# Patient Record
Sex: Female | Born: 1945 | Hispanic: Yes | Marital: Single | Smoking: Never smoker
Health system: Southern US, Community
[De-identification: ages and names within clinical notes are randomized; demographics above are authoritative.]

## PROBLEM LIST (undated history)

## (undated) DIAGNOSIS — I639 Cerebral infarction, unspecified: Secondary | ICD-10-CM

---

## 2017-01-16 ENCOUNTER — Emergency Department (HOSPITAL_COMMUNITY): Payer: Medicare (Managed Care)

## 2017-01-16 ENCOUNTER — Encounter (HOSPITAL_COMMUNITY): Payer: Self-pay | Admitting: Emergency Medicine

## 2017-01-16 ENCOUNTER — Inpatient Hospital Stay (HOSPITAL_COMMUNITY)
Admission: EM | Admit: 2017-01-16 | Discharge: 2017-02-03 | DRG: 100 | Disposition: E | Payer: Medicare (Managed Care) | Attending: Pulmonary Disease | Admitting: Pulmonary Disease

## 2017-01-16 ENCOUNTER — Inpatient Hospital Stay (HOSPITAL_COMMUNITY): Payer: Medicare (Managed Care)

## 2017-01-16 DIAGNOSIS — G300 Alzheimer's disease with early onset: Secondary | ICD-10-CM | POA: Diagnosis not present

## 2017-01-16 DIAGNOSIS — I959 Hypotension, unspecified: Secondary | ICD-10-CM | POA: Diagnosis not present

## 2017-01-16 DIAGNOSIS — Z79899 Other long term (current) drug therapy: Secondary | ICD-10-CM

## 2017-01-16 DIAGNOSIS — E7800 Pure hypercholesterolemia, unspecified: Secondary | ICD-10-CM | POA: Diagnosis present

## 2017-01-16 DIAGNOSIS — G9340 Encephalopathy, unspecified: Secondary | ICD-10-CM | POA: Diagnosis not present

## 2017-01-16 DIAGNOSIS — D72829 Elevated white blood cell count, unspecified: Secondary | ICD-10-CM | POA: Diagnosis not present

## 2017-01-16 DIAGNOSIS — R1110 Vomiting, unspecified: Secondary | ICD-10-CM | POA: Diagnosis not present

## 2017-01-16 DIAGNOSIS — I469 Cardiac arrest, cause unspecified: Secondary | ICD-10-CM | POA: Diagnosis not present

## 2017-01-16 DIAGNOSIS — G40901 Epilepsy, unspecified, not intractable, with status epilepticus: Principal | ICD-10-CM | POA: Diagnosis present

## 2017-01-16 DIAGNOSIS — K921 Melena: Secondary | ICD-10-CM | POA: Diagnosis present

## 2017-01-16 DIAGNOSIS — J9601 Acute respiratory failure with hypoxia: Secondary | ICD-10-CM | POA: Diagnosis not present

## 2017-01-16 DIAGNOSIS — R29810 Facial weakness: Secondary | ICD-10-CM | POA: Diagnosis present

## 2017-01-16 DIAGNOSIS — R4182 Altered mental status, unspecified: Secondary | ICD-10-CM | POA: Diagnosis present

## 2017-01-16 DIAGNOSIS — Z23 Encounter for immunization: Secondary | ICD-10-CM

## 2017-01-16 DIAGNOSIS — I10 Essential (primary) hypertension: Secondary | ICD-10-CM | POA: Diagnosis present

## 2017-01-16 DIAGNOSIS — G301 Alzheimer's disease with late onset: Secondary | ICD-10-CM | POA: Diagnosis not present

## 2017-01-16 DIAGNOSIS — F0280 Dementia in other diseases classified elsewhere without behavioral disturbance: Secondary | ICD-10-CM | POA: Diagnosis present

## 2017-01-16 DIAGNOSIS — I639 Cerebral infarction, unspecified: Secondary | ICD-10-CM | POA: Diagnosis not present

## 2017-01-16 DIAGNOSIS — J9811 Atelectasis: Secondary | ICD-10-CM

## 2017-01-16 DIAGNOSIS — R570 Cardiogenic shock: Secondary | ICD-10-CM | POA: Diagnosis not present

## 2017-01-16 DIAGNOSIS — Z1612 Extended spectrum beta lactamase (ESBL) resistance: Secondary | ICD-10-CM | POA: Diagnosis not present

## 2017-01-16 DIAGNOSIS — R739 Hyperglycemia, unspecified: Secondary | ICD-10-CM | POA: Diagnosis present

## 2017-01-16 DIAGNOSIS — R569 Unspecified convulsions: Secondary | ICD-10-CM

## 2017-01-16 DIAGNOSIS — Z9289 Personal history of other medical treatment: Secondary | ICD-10-CM

## 2017-01-16 DIAGNOSIS — R443 Hallucinations, unspecified: Secondary | ICD-10-CM | POA: Diagnosis not present

## 2017-01-16 DIAGNOSIS — I69354 Hemiplegia and hemiparesis following cerebral infarction affecting left non-dominant side: Secondary | ICD-10-CM | POA: Diagnosis not present

## 2017-01-16 DIAGNOSIS — R1310 Dysphagia, unspecified: Secondary | ICD-10-CM | POA: Diagnosis not present

## 2017-01-16 DIAGNOSIS — G40909 Epilepsy, unspecified, not intractable, without status epilepticus: Secondary | ICD-10-CM | POA: Diagnosis not present

## 2017-01-16 DIAGNOSIS — G309 Alzheimer's disease, unspecified: Secondary | ICD-10-CM | POA: Diagnosis present

## 2017-01-16 DIAGNOSIS — B9620 Unspecified Escherichia coli [E. coli] as the cause of diseases classified elsewhere: Secondary | ICD-10-CM | POA: Diagnosis present

## 2017-01-16 DIAGNOSIS — E876 Hypokalemia: Secondary | ICD-10-CM | POA: Diagnosis present

## 2017-01-16 DIAGNOSIS — Z66 Do not resuscitate: Secondary | ICD-10-CM | POA: Diagnosis not present

## 2017-01-16 DIAGNOSIS — R338 Other retention of urine: Secondary | ICD-10-CM | POA: Diagnosis not present

## 2017-01-16 DIAGNOSIS — R402352 Coma scale, best motor response, localizes pain, at arrival to emergency department: Secondary | ICD-10-CM | POA: Diagnosis present

## 2017-01-16 DIAGNOSIS — N390 Urinary tract infection, site not specified: Secondary | ICD-10-CM | POA: Diagnosis present

## 2017-01-16 DIAGNOSIS — R402112 Coma scale, eyes open, never, at arrival to emergency department: Secondary | ICD-10-CM | POA: Diagnosis present

## 2017-01-16 DIAGNOSIS — R1319 Other dysphagia: Secondary | ICD-10-CM | POA: Diagnosis not present

## 2017-01-16 DIAGNOSIS — I63131 Cerebral infarction due to embolism of right carotid artery: Secondary | ICD-10-CM | POA: Diagnosis not present

## 2017-01-16 DIAGNOSIS — I63511 Cerebral infarction due to unspecified occlusion or stenosis of right middle cerebral artery: Secondary | ICD-10-CM | POA: Diagnosis present

## 2017-01-16 DIAGNOSIS — R159 Full incontinence of feces: Secondary | ICD-10-CM | POA: Diagnosis present

## 2017-01-16 DIAGNOSIS — E162 Hypoglycemia, unspecified: Secondary | ICD-10-CM | POA: Diagnosis not present

## 2017-01-16 DIAGNOSIS — R402212 Coma scale, best verbal response, none, at arrival to emergency department: Secondary | ICD-10-CM | POA: Diagnosis present

## 2017-01-16 DIAGNOSIS — R092 Respiratory arrest: Secondary | ICD-10-CM

## 2017-01-16 DIAGNOSIS — R1312 Dysphagia, oropharyngeal phase: Secondary | ICD-10-CM | POA: Diagnosis not present

## 2017-01-16 DIAGNOSIS — R34 Anuria and oliguria: Secondary | ICD-10-CM | POA: Diagnosis present

## 2017-01-16 DIAGNOSIS — B9629 Other Escherichia coli [E. coli] as the cause of diseases classified elsewhere: Secondary | ICD-10-CM | POA: Diagnosis not present

## 2017-01-16 DIAGNOSIS — D72828 Other elevated white blood cell count: Secondary | ICD-10-CM | POA: Diagnosis not present

## 2017-01-16 DIAGNOSIS — F0281 Dementia in other diseases classified elsewhere with behavioral disturbance: Secondary | ICD-10-CM | POA: Diagnosis not present

## 2017-01-16 HISTORY — DX: Cerebral infarction, unspecified: I63.9

## 2017-01-16 LAB — I-STAT ARTERIAL BLOOD GAS, ED
ACID-BASE EXCESS: 4 mmol/L — AB (ref 0.0–2.0)
BICARBONATE: 26.4 mmol/L (ref 20.0–28.0)
O2 Saturation: 100 %
PCO2 ART: 33.7 mmHg (ref 32.0–48.0)
PH ART: 7.503 — AB (ref 7.350–7.450)
PO2 ART: 496 mmHg — AB (ref 83.0–108.0)
TCO2: 27 mmol/L (ref 22–32)

## 2017-01-16 LAB — BRAIN NATRIURETIC PEPTIDE: B Natriuretic Peptide: 57.3 pg/mL (ref 0.0–100.0)

## 2017-01-16 LAB — CBC
HEMATOCRIT: 43.9 % (ref 36.0–46.0)
Hemoglobin: 14.2 g/dL (ref 12.0–15.0)
MCH: 29.3 pg (ref 26.0–34.0)
MCHC: 32.3 g/dL (ref 30.0–36.0)
MCV: 90.7 fL (ref 78.0–100.0)
Platelets: 870 10*3/uL — ABNORMAL HIGH (ref 150–400)
RBC: 4.84 MIL/uL (ref 3.87–5.11)
RDW: 15.5 % (ref 11.5–15.5)
WBC: 17.1 10*3/uL — ABNORMAL HIGH (ref 4.0–10.5)

## 2017-01-16 LAB — COMPREHENSIVE METABOLIC PANEL
ALT: 28 U/L (ref 14–54)
ANION GAP: 13 (ref 5–15)
AST: 37 U/L (ref 15–41)
Albumin: 3.9 g/dL (ref 3.5–5.0)
Alkaline Phosphatase: 95 U/L (ref 38–126)
BUN: 17 mg/dL (ref 6–20)
CHLORIDE: 106 mmol/L (ref 101–111)
CO2: 18 mmol/L — ABNORMAL LOW (ref 22–32)
Calcium: 9.4 mg/dL (ref 8.9–10.3)
Creatinine, Ser: 0.91 mg/dL (ref 0.44–1.00)
Glucose, Bld: 158 mg/dL — ABNORMAL HIGH (ref 65–99)
POTASSIUM: 3.9 mmol/L (ref 3.5–5.1)
Sodium: 137 mmol/L (ref 135–145)
Total Bilirubin: 0.8 mg/dL (ref 0.3–1.2)
Total Protein: 7.7 g/dL (ref 6.5–8.1)

## 2017-01-16 LAB — I-STAT CHEM 8, ED
BUN: 19 mg/dL (ref 6–20)
CALCIUM ION: 1.13 mmol/L — AB (ref 1.15–1.40)
Chloride: 106 mmol/L (ref 101–111)
Creatinine, Ser: 0.7 mg/dL (ref 0.44–1.00)
Glucose, Bld: 162 mg/dL — ABNORMAL HIGH (ref 65–99)
HEMATOCRIT: 47 % — AB (ref 36.0–46.0)
HEMOGLOBIN: 16 g/dL — AB (ref 12.0–15.0)
POTASSIUM: 3.8 mmol/L (ref 3.5–5.1)
SODIUM: 139 mmol/L (ref 135–145)
TCO2: 20 mmol/L — AB (ref 22–32)

## 2017-01-16 LAB — DIFFERENTIAL
BASOS ABS: 0.1 10*3/uL (ref 0.0–0.1)
BASOS PCT: 1 %
EOS ABS: 0.3 10*3/uL (ref 0.0–0.7)
EOS PCT: 2 %
Lymphocytes Relative: 35 %
Lymphs Abs: 6 10*3/uL — ABNORMAL HIGH (ref 0.7–4.0)
MONOS PCT: 6 %
Monocytes Absolute: 1 10*3/uL (ref 0.1–1.0)
Neutro Abs: 9.8 10*3/uL — ABNORMAL HIGH (ref 1.7–7.7)
Neutrophils Relative %: 56 %

## 2017-01-16 LAB — ETHANOL: Alcohol, Ethyl (B): <10 mg/dL (ref <10)

## 2017-01-16 LAB — LACTIC ACID, PLASMA
LACTIC ACID, VENOUS: 3.2 mmol/L — AB (ref 0.5–1.9)
LACTIC ACID, VENOUS: 1.5 mmol/L (ref 0.5–1.9)

## 2017-01-16 LAB — GLUCOSE, CAPILLARY
GLUCOSE-CAPILLARY: 117 mg/dL — AB (ref 65–99)
Glucose-Capillary: 111 mg/dL — ABNORMAL HIGH (ref 65–99)

## 2017-01-16 LAB — MAGNESIUM: MAGNESIUM: 1.8 mg/dL (ref 1.7–2.4)

## 2017-01-16 LAB — PROCALCITONIN

## 2017-01-16 LAB — PROTIME-INR
INR: 1.04
Prothrombin Time: 13.6 seconds (ref 11.4–15.2)

## 2017-01-16 LAB — MRSA PCR SCREENING: MRSA by PCR: NEGATIVE

## 2017-01-16 LAB — TRIGLYCERIDES: TRIGLYCERIDES: 110 mg/dL (ref <150)

## 2017-01-16 LAB — TROPONIN I
Troponin I: <0.03 ng/mL (ref <0.03)
Troponin I: <0.03 ng/mL (ref <0.03)

## 2017-01-16 LAB — APTT: APTT: 28 s (ref 24–36)

## 2017-01-16 LAB — PHOSPHORUS: PHOSPHORUS: 2.9 mg/dL (ref 2.5–4.6)

## 2017-01-16 LAB — POC OCCULT BLOOD, ED: Fecal Occult Bld: POSITIVE — AB

## 2017-01-16 LAB — I-STAT TROPONIN, ED: TROPONIN I, POC: 0 ng/mL (ref 0.00–0.08)

## 2017-01-16 LAB — CORTISOL: CORTISOL PLASMA: 20.7 ug/dL

## 2017-01-16 MED ORDER — SODIUM CHLORIDE 0.9 % IV SOLN: 250.0000 mL | INTRAVENOUS | Status: DC | PRN

## 2017-01-16 MED ORDER — ETOMIDATE 2 MG/ML IV SOLN
INTRAVENOUS | Status: DC | PRN
  Administered 2017-01-16: 15 mg via INTRAVENOUS

## 2017-01-16 MED ORDER — PANTOPRAZOLE SODIUM 40 MG IV SOLR
40.0000 mg | Freq: Every day | INTRAVENOUS | Status: DC
  Administered 2017-01-16 – 2017-01-18 (×3): 40 mg via INTRAVENOUS
  Filled 2017-01-16 (×3): qty 40

## 2017-01-16 MED ORDER — INSULIN ASPART 100 UNIT/ML ~~LOC~~ SOLN
0.0000 [IU] | SUBCUTANEOUS | Status: DC
  Administered 2017-01-17 – 2017-01-23 (×4): 1 [IU] via SUBCUTANEOUS

## 2017-01-16 MED ORDER — HYDRALAZINE HCL 20 MG/ML IJ SOLN
10.0000 mg | INTRAMUSCULAR | Status: DC | PRN
  Administered 2017-01-17 – 2017-01-19 (×4): 10 mg via INTRAVENOUS
  Filled 2017-01-16 (×6): qty 1

## 2017-01-16 MED ORDER — SODIUM CHLORIDE 0.9 % IV SOLN
1500.0000 mg | INTRAVENOUS | Status: AC
  Administered 2017-01-16: 1500 mg via INTRAVENOUS
  Filled 2017-01-16: qty 15

## 2017-01-16 MED ORDER — LORAZEPAM 2 MG/ML IJ SOLN: 1.0000 mg | INTRAMUSCULAR | Status: DC | PRN

## 2017-01-16 MED ORDER — LEVETIRACETAM 500 MG/5ML IV SOLN
750.0000 mg | Freq: Two times a day (BID) | INTRAVENOUS | Status: DC
  Administered 2017-01-17 – 2017-01-23 (×14): 750 mg via INTRAVENOUS
  Filled 2017-01-16 (×16): qty 7.5

## 2017-01-16 MED ORDER — SODIUM CHLORIDE 0.9 % IV BOLUS (SEPSIS): 1000.0000 mL | Freq: Once | INTRAVENOUS | Status: AC

## 2017-01-16 MED ORDER — HEPARIN SODIUM (PORCINE) 5000 UNIT/ML IJ SOLN
5000.0000 [IU] | Freq: Three times a day (TID) | INTRAMUSCULAR | Status: DC
  Administered 2017-01-16 – 2017-01-24 (×23): 5000 [IU] via SUBCUTANEOUS
  Filled 2017-01-16 (×23): qty 1

## 2017-01-16 MED ORDER — PROPOFOL 1000 MG/100ML IV EMUL
INTRAVENOUS | Status: AC
  Filled 2017-01-16: qty 100

## 2017-01-16 MED ORDER — IOPAMIDOL (ISOVUE-370) INJECTION 76%
INTRAVENOUS | Status: AC
  Administered 2017-01-16: 100 mL via INTRAVENOUS
  Filled 2017-01-16: qty 100

## 2017-01-16 MED ORDER — LORAZEPAM 2 MG/ML IJ SOLN
1.0000 mg | INTRAMUSCULAR | Status: AC
  Administered 2017-01-16: 1 mg via INTRAVENOUS
  Filled 2017-01-16: qty 1

## 2017-01-16 MED ORDER — LORAZEPAM BOLUS VIA INFUSION: 2.0000 mg | Freq: Once | INTRAVENOUS | Status: DC

## 2017-01-16 MED ORDER — PROPOFOL 1000 MG/100ML IV EMUL
5.0000 ug/kg/min | INTRAVENOUS | Status: DC
  Administered 2017-01-16: 10 ug/kg/min via INTRAVENOUS

## 2017-01-16 MED ORDER — LORAZEPAM 2 MG/ML IJ SOLN
2.0000 mg | Freq: Once | INTRAMUSCULAR | Status: AC
  Administered 2017-01-16: 2 mg via INTRAVENOUS

## 2017-01-16 MED ORDER — LORAZEPAM 2 MG/ML IJ SOLN
INTRAMUSCULAR | Status: AC
  Filled 2017-01-16: qty 1

## 2017-01-16 MED ORDER — SODIUM CHLORIDE 0.9 % IV SOLN
100.0000 mg | Freq: Two times a day (BID) | INTRAVENOUS | Status: DC
  Administered 2017-01-16 – 2017-01-24 (×16): 100 mg via INTRAVENOUS
  Filled 2017-01-16 (×23): qty 10

## 2017-01-16 MED ORDER — ROCURONIUM BROMIDE 50 MG/5ML IV SOLN
INTRAVENOUS | Status: DC | PRN
  Administered 2017-01-16: 50 mg via INTRAVENOUS

## 2017-01-16 NOTE — ED Notes (Signed)
Pt is waking up, moving legs, more responsive. Dr aware

## 2017-01-16 NOTE — Progress Notes (Addendum)
Called to examine patient for breakthrough seizure after extubation. The patient was speaking with nursing staff, then deviated head the left with leftward eye deviation followed by posturing and generalized seizure activity. Neurology to room STAT following verbal order for 1 mg IV Ativan. Patient still seizing on arrival with Ativan not yet administered: LUE extended, RUE flexed, BLE extended and with diffuse low amplitude tremor-like jerking. Following 1 mg Ativan, seizure activity continued. An additional 2 mg Ativan was administered with cessation of seizure activity.   Assessment/Recommendations: Focal onset seizures with secondary generalization emanating from the prior right frontal ischemic stroke area based on semiology of the events. Now with breakthrough seizure following extubation and discontinuation of propofol.   1. STAT 24 hour EEG ordered. Discussed with EEG technician.  2. Continue Vimpat 100 mg IV q12h.   3. Keppra 1500 mg IV load x 1 now. Continue at scheduled dose of 750 mg IV BID. 4. Ativan PRN seizure. Call Neurology if she remains postictal or if clinical seizure activity recurs.   Addendum 11:30 PM 01/06/2017: Initial tracings of LTM EEG preliminarily reviewed, demonstrating no electrographic seizures.   Electronically signed: Dr. Caryl Pina

## 2017-01-16 NOTE — ED Notes (Addendum)
Propofol started at 10 mcg/kg/m

## 2017-01-16 NOTE — ED Provider Notes (Signed)
[facility]-EMERGENCY DEPT Provider Note  CSN: [id number] Arrival date & time: 2017/01/23  1508  History   Chief Complaint Chief Complaint  Patient presents with  . Code Stroke   HPI Sandra Johnston is a 71 y.o. female.  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  The patient was complaining of a headache after church today.  She had stiffening of her left leg when getting into the car with multiple episodes of vomiting, prompting the call to EMS.  Family was concerned for a stroke.  After EMS arrival, she had multiple witnessed seizures en route to the ED with bilateral shaking of her lower extremities and wrists as well as leftward eye deviation.  She vomited en route to the hospital and had an episode of stool incontinence.  She was unresponsive at the time of ED arrival with no spontaneous respirations.  EMS was unable to obtain a blood pressure, however the patient was tachycardic en route.   The history is provided by the EMS personnel and a relative. No language interpreter was used.   No past medical history on file.  Patient Active Problem List   Diagnosis Date Noted  . Seizures (HCC) 01-23-2017   No past surgical history on file.  OB History    No data available     Home Medications    Prior to Admission medications   Not on File   Family History No family history on file.  Social History Social History  Substance Use Topics  . Smoking status: Not on file  . Smokeless tobacco: Not on file  . Alcohol use Not on file   Allergies   Patient has no allergy information on record.  Review of Systems Review of Systems  Unable to perform ROS: Mental status change   Physical Exam Updated Vital Signs BP (!) 186/97   Pulse 90   Temp (S) 97.7 F (36.5 C)  (Rectal)   Resp 14   Ht  (1.499 m)   Wt 47 kg (103 lb 9.9 oz)   LMP  (LMP Unknown)   SpO2 100%   BMI 20.93 kg/m   Physical Exam  Constitutional: She appears well-developed and well-nourished.  HENT:  Head: Normocephalic and atraumatic.  Eyes: Pupils are equal, round, and reactive to light. Conjunctivae are normal.  Pupils 3mm and briskly responsive to light bilaterally; no eye deviation noted on my exam  Neck: Neck supple.  Cardiovascular: Normal rate, regular rhythm, normal heart sounds and intact distal pulses.   No murmur heard. Pulmonary/Chest: Breath sounds normal. No stridor. She has no wheezes. She has no rales.  No spontaneous respirations  Abdominal: Soft. There is no tenderness. There is no guarding.  Vomitus present  Genitourinary: Rectal exam shows guaiac positive stool.  Genitourinary Comments: Stool incontinence with small amount of bright red blood noted  Musculoskeletal: She exhibits no edema.  Neurological: GCS eye subscore is 1. GCS verbal subscore is 1. GCS motor subscore is 4.  Withdraws all four extremities to pain; spontaneous movement of right upper and lower extremity noted  Skin: Skin is warm and dry.  Nursing note and vitals reviewed.  ED Treatments / Results  Labs (all labs ordered are listed, but only abnormal results are displayed) Labs Reviewed  CBC - Abnormal;  Notable for the following:       Result Value   WBC 17.1 (*)    Platelets 870 (*)    All other components within normal limits  DIFFERENTIAL - Abnormal; Notable for the following:    Neutro Abs 9.8 (*)    Lymphs Abs 6.0 (*)    All other components within normal limits  COMPREHENSIVE METABOLIC PANEL - Abnormal; Notable for the following:    CO2 18 (*)    Glucose, Bld 158 (*)    All other components within normal limits  I-STAT CHEM 8, ED - Abnormal; Notable for the following:    Glucose, Bld 162 (*)    Calcium, Ion 1.13 (*)    TCO2 20 (*)    Hemoglobin 16.0 (*)    HCT 47.0  (*)    All other components within normal limits  POC OCCULT BLOOD, ED - Abnormal; Notable for the following:    Fecal Occult Bld POSITIVE (*)    All other components within normal limits  I-STAT ARTERIAL BLOOD GAS, ED - Abnormal; Notable for the following:    pH, Arterial 7.503 (*)    pO2, Arterial 496.0 (*)    Acid-Base Excess 4.0 (*)    All other components within normal limits  PROTIME-INR  APTT  TRIGLYCERIDES  ETHANOL  URINALYSIS, ROUTINE W REFLEX MICROSCOPIC  RAPID URINE DRUG SCREEN, HOSP PERFORMED  CBC  CREATININE, SERUM  TROPONIN I  TROPONIN I  TROPONIN I  LACTIC ACID, PLASMA  LACTIC ACID, PLASMA  PROCALCITONIN  BRAIN NATRIURETIC PEPTIDE  CORTISOL  BLOOD GAS, ARTERIAL  I-STAT TROPONIN, ED  CBG MONITORING, ED   EKG  EKG Interpretation  Date/Time:  Sunday 01-17-2017 15:09:39 EDT Ventricular Rate:  103 PR Interval:    QRS Duration: 83 QT Interval:  339 QTC Calculation: 444 R Axis:   68 Text Interpretation:  Sinus tachycardia Abnormal R-wave progression, early transition Minimal ST depression, lateral leads No old tracing to compare Confirmed by Pricilla Loveless [id number]) on 01-17-2017 3:20:54 PM      Radiology Ct Angio Head W Or Wo Contrast  Result Date: 01/17/2017 CLINICAL DATA:  Code stroke. 71 year old female code stroke, nonverbal, headache, seizure.  Last seen normal 1420 hours. EXAM: CT ANGIOGRAPHY HEAD AND NECK TECHNIQUE: Multidetector CT imaging of the head and neck was performed using the standard protocol during bolus administration of intravenous contrast. Multiplanar CT image reconstructions and MIPs were obtained to evaluate the vascular anatomy. Carotid stenosis measurements (when applicable) are obtained utilizing NASCET criteria, using the distal internal carotid diameter as the denominator. CONTRAST:  50 mL Isovue 370 COMPARISON:  Head CT without contrast 1547 hours today. FINDINGS: CTA NECK Skeleton: Absent maxillary dentition and occasional  poor mandible dentition. Degenerative changes in the cervical spine. No acute osseous abnormality identified. Upper chest: Intubated. Endotracheal tube terminates above the carina. Enteric tube courses into the thoracic esophagus. Negative lung apices aside from mild scarring and emphysema. No superior mediastinal lymphadenopathy. Other neck: Fluid in the pharynx and posterior nasal cavity in the setting of intubation. Otherwise negative neck soft tissues. Aortic arch: Good contrast bolus timing. Bovine type arch configuration. Mild arch atherosclerosis. No great vessel origin stenosis. Right carotid system: Patent carotid stent from the distal right CCA to the right ICA distal to the bulb. There is mild to moderate in stent stenosis at the level of the right ICA origin and bulb due to combined waist of the stent and some internal soft plaque (series 9,  image 64). Stenosis estimated at 65% with respect to the distal vessel. Distal to the stent the cervical right ICA is negative. Left carotid system: Bovine left CCA origin with no stenosis. Mild plaque at the left carotid bifurcation. No cervical left carotid stenosis. Vertebral arteries: No proximal right subclavian artery stenosis despite soft and calcified plaque at its origin. Normal right vertebral artery origin. Normal right vertebral artery to the skullbase. No proximal left subclavian artery stenosis despite calcified plaque. Normal origin of the dominant left vertebral artery. Normal left vertebral artery to the skullbase. CTA HEAD Posterior circulation: Dominant distal left and non dominant right vertebral arteries are normal to the vertebrobasilar junction. Patent PICA origins. Normal basilar artery. Normal SCA and PCA origins. Posterior communicating arteries are diminutive or absent. Normal PCA branches. Anterior circulation: Both ICA siphons are patent without stenosis. Mild calcified plaque on the left. Normal ophthalmic artery origins. Normal carotid  termini, MCA and ACA origins. Mildly dominant left ACA A1. Anterior communicating artery and bilateral ACA branches are normal. Left MCA M1 segment, bifurcation, and left MCA branches appear normal. Right MCA M1 segment, bifurcation, and right MCA branches appear normal. Venous sinuses: Early contrast bolus timing. Superior sagittal sinus and straight sinus are patent. Anatomic variants: Dominant left vertebral artery. Bovine type aortic arch configuration. Review of the MIP images confirms the above findings IMPRESSION: 1. Negative for emergent large vessel occlusion. And no intracranial arterial stenosis or occlusion identified. 2. Positive for patent right cervical carotid stent with in-stent stenosis at the level of the right ICA origin up to 65%. 3. Negative left carotid and bilateral vertebral arteries. 4. Intubated. ET tube terminates above the carina. Enteric tube courses into the esophagus. Preliminary report of #1 was relayed via text pager to Dr. Weston Settle on 01/31/2017 at 1617 hours. Electronically Signed   By: Odessa Fleming M.D.   On: 01/15/2017 16:26   Ct Angio Neck W Or Wo Contrast  Result Date: 01/14/2017 CLINICAL DATA:  Code stroke. 71 year old female code stroke, nonverbal, headache, seizure.  Last seen normal 1420 hours. EXAM: CT ANGIOGRAPHY HEAD AND NECK TECHNIQUE: Multidetector CT imaging of the head and neck was performed using the standard protocol during bolus administration of intravenous contrast. Multiplanar CT image reconstructions and MIPs were obtained to evaluate the vascular anatomy. Carotid stenosis measurements (when applicable) are obtained utilizing NASCET criteria, using the distal internal carotid diameter as the denominator. CONTRAST:  50 mL Isovue 370 COMPARISON:  Head CT without contrast 1547 hours today. FINDINGS: CTA NECK Skeleton: Absent maxillary dentition and occasional poor mandible dentition. Degenerative changes in the cervical spine. No acute osseous  abnormality identified. Upper chest: Intubated. Endotracheal tube terminates above the carina. Enteric tube courses into the thoracic esophagus. Negative lung apices aside from mild scarring and emphysema. No superior mediastinal lymphadenopathy. Other neck: Fluid in the pharynx and posterior nasal cavity in the setting of intubation. Otherwise negative neck soft tissues. Aortic arch: Good contrast bolus timing. Bovine type arch configuration. Mild arch atherosclerosis. No great vessel origin stenosis. Right carotid system: Patent carotid stent from the distal right CCA to the right ICA distal to the bulb. There is mild to moderate in stent stenosis at the level of the right ICA origin and bulb due to combined waist of the stent and some internal soft plaque (series 9, image 64). Stenosis estimated at 65% with respect to the distal vessel. Distal to the stent the cervical right ICA is negative. Left carotid system: Bovine left CCA  origin with no stenosis. Mild plaque at the left carotid bifurcation. No cervical left carotid stenosis. Vertebral arteries: No proximal right subclavian artery stenosis despite soft and calcified plaque at its origin. Normal right vertebral artery origin. Normal right vertebral artery to the skullbase. No proximal left subclavian artery stenosis despite calcified plaque. Normal origin of the dominant left vertebral artery. Normal left vertebral artery to the skullbase. CTA HEAD Posterior circulation: Dominant distal left and non dominant right vertebral arteries are normal to the vertebrobasilar junction. Patent PICA origins. Normal basilar artery. Normal SCA and PCA origins. Posterior communicating arteries are diminutive or absent. Normal PCA branches. Anterior circulation: Both ICA siphons are patent without stenosis. Mild calcified plaque on the left. Normal ophthalmic artery origins. Normal carotid termini, MCA and ACA origins. Mildly dominant left ACA A1. Anterior communicating  artery and bilateral ACA branches are normal. Left MCA M1 segment, bifurcation, and left MCA branches appear normal. Right MCA M1 segment, bifurcation, and right MCA branches appear normal. Venous sinuses: Early contrast bolus timing. Superior sagittal sinus and straight sinus are patent. Anatomic variants: Dominant left vertebral artery. Bovine type aortic arch configuration. Review of the MIP images confirms the above findings IMPRESSION: 1. Negative for emergent large vessel occlusion. And no intracranial arterial stenosis or occlusion identified. 2. Positive for patent right cervical carotid stent with in-stent stenosis at the level of the right ICA origin up to 65%. 3. Negative left carotid and bilateral vertebral arteries. 4. Intubated. ET tube terminates above the carina. Enteric tube courses into the esophagus. Preliminary report of #1 was relayed via text pager to Dr. Weston Settle on 01/08/2017 at 1617 hours. Electronically Signed   By: Odessa Fleming M.D.   On: 01/06/2017 16:26   Dg Chest Portable 1 View  Result Date: 01/05/2017 CLINICAL DATA:  Tube adjustment. EXAM: PORTABLE CHEST 1 VIEW COMPARISON:  Radiograph of same day. FINDINGS: The heart size and mediastinal contours are within normal limits. Distal tip of endotracheal tube is now 4 cm above the carina, in grossly good position. Nasogastric tube is seen entering stomach. No pneumothorax or pleural effusion is noted. Both lungs are clear. The visualized skeletal structures are unremarkable. IMPRESSION: Endotracheal tube is in good position status post repositioning. No acute cardiopulmonary abnormality seen. Electronically Signed   By: Lupita Raider, M.D.   On: 01/06/2017 15:42   Dg Chest Portable 1 View  Result Date: 01/07/2017 CLINICAL DATA:  Tube placement. EXAM: PORTABLE CHEST 1 VIEW COMPARISON:  None. FINDINGS: The heart size and mediastinal contours are within normal limits. Endotracheal tube tip is seen approximately 2 cm above the  carina. Nasogastric tube is seen with tip in expected position of proximal stomach. No pneumothorax or pleural effusion is noted. Both lungs are clear. The visualized skeletal structures are unremarkable. IMPRESSION: Endotracheal and nasogastric tubes are in grossly good position. No acute cardiopulmonary abnormality seen. Electronically Signed   By: Lupita Raider, M.D.   On: 01/09/2017 15:39   Ct Head Code Stroke Wo Contrast  Result Date: 01/18/2017 CLINICAL DATA:  Code stroke. 71 year old female code stroke, nonverbal, headache, seizure. Last seen normal 1420 hours. EXAM: CT HEAD WITHOUT CONTRAST TECHNIQUE: Contiguous axial images were obtained from the base of the skull through the vertex without intravenous contrast. COMPARISON:  None. FINDINGS: Brain: Chronic appearing encephalomalacia at the right operculum and insula tracking toward the superior right peri-Rolandic cortex. Mild asymmetric enlargement of the right lateral ventricle. Elsewhere confluent bilateral abnormal cerebral white matter hypodensity. Confluent  acute to subacute appearing hypodensity throughout the right basal ganglia (series 3, image 16). Patchy heterogeneous hypodensity throughout the bilateral thalami. No acute intracranial hemorrhage identified. No midline shift, mass effect, or evidence of intracranial mass lesion. No other cytotoxic edema identified. Vascular: Calcified atherosclerosis at the skull base. Skull: Negative. Sinuses/Orbits: Intubated on the scout view. Fluid in the pharynx and nasal cavity. Small fluid levels in both sphenoid sinuses. Otherwise well pneumatized. Other: Visualized orbits and scalp soft tissues are within normal limits. ASPECTS ([place]ber[place] Stroke Program Early CT Score) - Ganglionic level infarction (caudate, lentiform nuclei, internal capsule, insula, M1-M3 cortex): 4 (right insula, M1 and M2 thought to be chronic) - Supraganglionic infarction (M4-M6 cortex): 3 ( right M 5 thought to be chronic) Total  score (0-10 with 10 being normal): 7 IMPRESSION: 1. Mix of acute/subacute and chronic ischemia suspected in the right MCA territory. No associated hemorrhage or mass effect. 2. ASPECTS is 7 3. Underlying chronic small vessel disease. 4. The above was relayed via text pager to Dr. Johnn Hai on 01/13/2017 at 15:50 . Electronically Signed   By: Odessa Fleming M.D.   On: 01/03/2017 15:50    Procedures Procedure Name: Intubation Date/Time: 01/17/2017 4:00 PM Performed by: Levester Fresh Pre-anesthesia Checklist: Emergency Drugs available, Suction available and Patient being monitored Oxygen Delivery Method: Ambu bag Preoxygenation: Pre-oxygenation with 100% oxygen Induction Type: Rapid sequence Ventilation: Mask ventilation without difficulty Laryngoscope Size: Glidescope and 4 Grade View: Grade I Tube size: 7.5 mm Placement Confirmation: ETT inserted through vocal cords under direct vision,  Positive ETCO2 and Breath sounds checked- equal and bilateral Secured at: 21 cm Comments: ETT repositioning following CXR      (including critical care time)  Medications Ordered in ED Medications  etomidate (AMIDATE) injection (15 mg Intravenous Given 01/05/2017 1513)  rocuronium (ZEMURON) injection (50 mg Intravenous Given 01/06/2017 1514)  propofol (DIPRIVAN) 1000 MG/100ML infusion (not administered)  propofol (DIPRIVAN) 1000 MG/100ML infusion (5 mcg/kg/min  47 kg Intravenous Rate/Dose Change 01/15/2017 1535)  sodium chloride 0.9 % bolus 1,000 mL (not administered)  lacosamide (VIMPAT) 100 mg in sodium chloride 0.9 % 25 mL IVPB (not administered)  0.9 %  sodium chloride infusion (not administered)  heparin injection 5,000 Units (not administered)  pantoprazole (PROTONIX) injection 40 mg (not administered)  hydrALAZINE (APRESOLINE) injection 10 mg (not administered)  iopamidol (ISOVUE-370) 76 % injection (100 mLs Intravenous Contrast Given 01/14/2017 1550)   Initial Impression / Assessment and Plan / ED  Course  I have reviewed the triage vital signs and the nursing notes.  Pertinent labs & imaging results that were available during my care of the patient were reviewed by me and considered in my medical decision making (see chart for details).    Initial differential diagnosis included ischemic stroke, hemorrhagic stroke, hypoglycemia, seizure, hypoxia, and drug intoxication.  The patient was intubated for airway protection and subsequently sedated with propofol; see details of intubation procedure above.  Pertinent labs included CBC with leukocytosis and thrombocytosis; no anemia.  CMP with reduced bicarbonate, however no other acute electrolyte abnormalities, AKI, or transaminitis.  Troponin normal.  ABG pH 7.5. BNP normal.  Lactic acid elevated to 3.2. Ethanol level normal.  EKG with sinus tachycardia and no axis deviation. Normal PR interval, narrow QRS complex, and normal QT C. ST depressions noted in V4 through V6.  Imaging studies included a CXR with no acute cardiopulmonary abnormalities and appropriate ETT placement. Right MCA acute vs. chronic ischemia noted without hemorrhage or mass effect  on head CT.  No large vessel occlusion noted on CTA.    Based on the above findings, I suspect the patient most likely had a seizure that may have been presented initially with stroke-like symptoms.  Based on bystander and EMS reports, the patient initially had focal symptomatology, followed by generalized seizure-like activity.  4:42 PM Family updated on plan for admission.  Neurology present for this discussion.  The patient was admitted to the Neurology ICU for further evaluation and treatment including an EEG tomorrow.  The patient was in critical condition at the time of admission.  Final Clinical Impressions(s) / ED Diagnoses   Final diagnoses:  Seizure (HCC)  Altered mental status, unspecified altered mental status type   New Prescriptions New Prescriptions   No medications on file       Levester Fresh, MD 01/17/17 1610    Pricilla Loveless, MD 01/19/17 [phone]

## 2017-01-16 NOTE — H&P (Signed)
PULMONARY / CRITICAL CARE MEDICINE   Name: Sandra Johnston MRN: [id number] DOB: [birth year]    ADMISSION DATE:  01/26/2017 CONSULTATION DATE:    REFERRING MD:  EDP  CHIEF COMPLAINT:  Stoke, seizures  HISTORY OF PRESENT ILLNESS:   71 year old with history of epilepsy, progressive Alzheimer's dementia. 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 She moved recently to [place] to be with her daughter. As per the family she requires help with activities of daily living.  On 10/14 she developed altered mental status, sudden confusion with eye deviation and tonic-clonic activity. She was intubated due to poor mental status. CT scan shows changes consistent with old CVA. Neurology consulted and PCCM called for help with admission.  PAST MEDICAL HISTORY :  She  has no past medical history on file.  PAST SURGICAL HISTORY: She  has no past surgical history on file.  Not on File  No current facility-administered medications on file prior to encounter.    No current outpatient prescriptions on file prior to encounter.   Medications Atorvastatin 80 mg daily Celexa 10 mg daily at bedtime Plavix 75 mg daily Aspirin 81 mg daily  No known allergies  FAMILY HISTORY:  Her has no family status information on file.    SOCIAL HISTORY:  REVIEW OF SYSTEMS:   Unable to obtain as patient is intubated  SUBJECTIVE:    VITAL SIGNS: BP (!) 186/97   Pulse 90   Temp (S) 97.7 F (36.5 C) (Rectal)   Resp 14   Wt 103 lb 9.9 oz (47 kg)   LMP  (LMP Unknown)   SpO2 100%   HEMODYNAMICS:    VENTILATOR SETTINGS: Vent Mode: PRVC FiO2 (%):  [100 %] 100 % Set Rate:  [14 bmp] 14 bmp Vt Set:  [550 mL] 550 mL PEEP:  [5 cmH20] 5 cmH20 Plateau Pressure:  [14 cmH20] 14 cmH20  INTAKE / OUTPUT: No intake/output data recorded.  PHYSICAL EXAMINATION: Blood pressure (!) 186/97, pulse 90, temperature (S) 97.7 F (36.5 C), temperature source Rectal, resp. rate  14, height _0  (1.499 m), weight 103 lb 9.9 oz (47 kg), SpO2 100 %. Gen:      No acute distress, frail elderly HEENT:  EOMI, sclera anicteric, ETT in place Neck:     No masses; no thyromegaly Lungs:    Clear to auscultation bilaterally; normal respiratory effort CV:         Regular rate and rhythm; no murmurs Abd:      + bowel sounds; soft, non-tender; no palpable masses, no distension Ext:    No edema; adequate peripheral perfusion Skin:      Warm and dry; no rash Neuro: Sedated, unresponsive  LABS:  BMET  Recent Labs Lab 01/23/2017 1500 01/14/2017 1515  NA 137 139  K 3.9 3.8  CL 106 106  CO2 18*  --   BUN 17 19  CREATININE 0.91 0.70  GLUCOSE 158* 162*    Electrolytes  Recent Labs Lab 02/02/2017 1500  CALCIUM 9.4    CBC  Recent Labs Lab 01/26/2017 1500 01/17/2017 1515  WBC 17.1*  --   HGB 14.2 16.0*  HCT 43.9 47.0*  PLT 870*  --     Coag's  Recent Labs Lab 01/12/2017 1500  APTT 28  INR 1.04    Sepsis Markers No results for input(s): LATICACIDVEN, PROCALCITON, O2SATVEN in the last 168 hours.  ABG No results for input(s): PHART, PCO2ART, PO2ART in the last 168 hours.  Liver Enzymes  Recent Labs Lab 02/01/2017 1500  AST 37  ALT 28  ALKPHOS 95  BILITOT 0.8  ALBUMIN 3.9    Cardiac Enzymes No results for input(s): TROPONINI, PROBNP in the last 168 hours.  Glucose No results for input(s): GLUCAP in the last 168 hours.  Imaging Dg Chest Portable 1 View  Result Date: 01/23/2017 CLINICAL DATA:  Tube adjustment. EXAM: PORTABLE CHEST 1 VIEW COMPARISON:  Radiograph of same day. FINDINGS: The heart size and mediastinal contours are within normal limits. Distal tip of endotracheal tube is now 4 cm above the carina, in grossly good position. Nasogastric tube is seen entering stomach. No pneumothorax or pleural effusion is noted. Both lungs are clear. The visualized skeletal structures are unremarkable. IMPRESSION: Endotracheal tube is in good position status  post repositioning. No acute cardiopulmonary abnormality seen. Electronically Signed   By: Marijo Conception, M.D.   On: 01/23/2017 15:42   Dg Chest Portable 1 View  Result Date: 01/26/2017 CLINICAL DATA:  Tube placement. EXAM: PORTABLE CHEST 1 VIEW COMPARISON:  None. FINDINGS: The heart size and mediastinal contours are within normal limits. Endotracheal tube tip is seen approximately 2 cm above the carina. Nasogastric tube is seen with tip in expected position of proximal stomach. No pneumothorax or pleural effusion is noted. Both lungs are clear. The visualized skeletal structures are unremarkable. IMPRESSION: Endotracheal and nasogastric tubes are in grossly good position. No acute cardiopulmonary abnormality seen. Electronically Signed   By: Marijo Conception, M.D.   On: 01/04/2017 15:39   Ct Head Code Stroke Wo Contrast  Result Date: 01/07/2017 CLINICAL DATA:  Code stroke. 71 year old female code stroke, nonverbal, headache, seizure. Last seen normal 1420 hours. EXAM: CT HEAD WITHOUT CONTRAST TECHNIQUE: Contiguous axial images were obtained from the base of the skull through the vertex without intravenous contrast. COMPARISON:  None. FINDINGS: Brain: Chronic appearing encephalomalacia at the right operculum and insula tracking toward the superior right peri-Rolandic cortex. Mild asymmetric enlargement of the right lateral ventricle. Elsewhere confluent bilateral abnormal cerebral white matter hypodensity. Confluent acute to subacute appearing hypodensity throughout the right basal ganglia (series 3, image 16). Patchy heterogeneous hypodensity throughout the bilateral thalami. No acute intracranial hemorrhage identified. No midline shift, mass effect, or evidence of intracranial mass lesion. No other cytotoxic edema identified. Vascular: Calcified atherosclerosis at the skull base. Skull: Negative. Sinuses/Orbits: Intubated on the scout view. Fluid in the pharynx and nasal cavity. Small fluid levels in  both sphenoid sinuses. Otherwise well pneumatized. Other: Visualized orbits and scalp soft tissues are within normal limits. ASPECTS ([place] Stroke Program Early CT Score) - Ganglionic level infarction (caudate, lentiform nuclei, internal capsule, insula, M1-M3 cortex): 4 (right insula, M1 and M2 thought to be chronic) - Supraganglionic infarction (M4-M6 cortex): 3 ( right M 5 thought to be chronic) Total score (0-10 with 10 being normal): 7 IMPRESSION: 1. Mix of acute/subacute and chronic ischemia suspected in the right MCA territory. No associated hemorrhage or mass effect. 2. ASPECTS is 7 3. Underlying chronic small vessel disease. 4. The above was relayed via text pager to Dr. Alessandra Bevels on 01/19/2017 at 15:50 . Electronically Signed   By: Genevie Ann M.D.   On: 01/05/2017 15:50    STUDIES:   CULTURES:  ANTIBIOTICS:  SIGNIFICANT EVENTS:  LINES/TUBES: ETT 10/14 >  DISCUSSION: 71 year old with history of seizure disorder, Alzheimer's, prior CVA with left hemiparesis admitted with seizure activity, intubated due to poor mental status and inability to protect airway  ASSESSMENT /  PLAN:  PULMONARY A: Intubated for poor mental status P:   Continue full vent support. ABG reviewed. Shows resp alk > TV reduced Follow CXR  CARDIOVASCULAR A:  Hypertension P:  Tele monitoring Hydralazine PRN  RENAL A:   Stable P:   Follow urine output and cr  GASTROINTESTINAL A:   Stable P:   Start tube feeds in AM Protonix for SUP  HEMATOLOGIC A:   Leukocytosis. Likely stress P:   INFECTIOUS A:   No evidence of infection P:   Observe off antibiotics  ENDOCRINE A:   Hyperglycemia P:   Continue SSI coverage  NEUROLOGIC A:   Seizures Prior stoke Alzheimer's demetia P:   RASS goal: 0 Propofol gtt Fentanyl and versed PRN AEDs per neurology Follow EEG  FAMILY  - Updates: Family updated at bedside. I spoke with the daughter Katharine Look and son-in-law. They stated that Mrs.  Klinkner would not want to be on life support and were surprised to be told that she is currently on life support with the ventilator. I discussed code status further including DNR. They wish to discuss with the patient's other daughter before making a final decision. She will remain full code for now. We will need to continue goals of care discussion tomorrow. - Inter-disciplinary family meet or Palliative Care meeting due by:  10/21  The patient is critically ill with multiple organ system failure and requires high complexity decision making for assessment and support, frequent evaluation and titration of therapies, advanced monitoring, review of radiographic studies and interpretation of complex data.   Critical Care Time devoted to patient care services, exclusive of separately billable procedures, described in this note is 35 minutes.   Marshell Garfinkel MD [facility] Pager [phone] If no answer or after 3pm call: [phone] 01/28/2017, 5:08 PM

## 2017-01-16 NOTE — ED Notes (Signed)
Pt is awake, critical care will see pt on unit regarding extubation.

## 2017-01-16 NOTE — Consult Note (Signed)
Reason for Consult: Code stroke Referring Physician: ER  Sandra Johnston is an 71 y.o. female.  HPI: Patient with history of epilepsy since childhood, prior stroke, high cholesterol, and Alzheimer's dementia was described by daughter as having sudden confusion, reduced responsiveness, eye deviation to the left, and left arm and leg tonic clonic activity.  EMS were called and she had several secondarily generalized seizures en route.  She was given Midazolam 2.5 mg IV.  Due to mental status, she was intubated with Etomidate and Rocuronium.  She was taken for CT Brain which showed a right right frontal and parietal large old cortical infarct.  There is also evidence of extensive periventricular and subcortical small vessel ischemic disease.  CTA shows no LVO.  No stenosis.  No aneurysm.  She was placed on low dose Propofol gtt at 5 mcg/kg/min.    No past medical history on file.  No past surgical history on file.  No family history on file.  Social History:  has no tobacco, alcohol, and drug history on file.  Allergies: Not on File  Prior to Admission medications   Not on File    Medications: Prior to Admission: ASA, Plavix, Lipitor, Celexa  Results for orders placed or performed during the hospital encounter of 01/19/2017 (from the past 48 hour(s))  Protime-INR     Status: None   Collection Time: 01/18/2017  3:00 PM  Result Value Ref Range   Prothrombin Time 13.6 11.4 - 15.2 seconds   INR 1.04   APTT     Status: None   Collection Time: 02/02/2017  3:00 PM  Result Value Ref Range   aPTT 28 24 - 36 seconds  CBC     Status: Abnormal   Collection Time: 01/03/2017  3:00 PM  Result Value Ref Range   WBC 17.1 (H) 4.0 - 10.5 K/uL   RBC 4.84 3.87 - 5.11 MIL/uL   Hemoglobin 14.2 12.0 - 15.0 g/dL   HCT 43.9 36.0 - 46.0 %   MCV 90.7 78.0 - 100.0 fL   MCH 29.3 26.0 - 34.0 pg   MCHC 32.3 30.0 - 36.0 g/dL   RDW 15.5 11.5 - 15.5 %   Platelets 870 (H) 150 - 400 K/uL  Differential     Status:  Abnormal   Collection Time: 01/14/2017  3:00 PM  Result Value Ref Range   Neutrophils Relative % 56 %   Neutro Abs 9.8 (H) 1.7 - 7.7 K/uL   Lymphocytes Relative 35 %   Lymphs Abs 6.0 (H) 0.7 - 4.0 K/uL   Monocytes Relative 6 %   Monocytes Absolute 1.0 0.1 - 1.0 K/uL   Eosinophils Relative 2 %   Eosinophils Absolute 0.3 0.0 - 0.7 K/uL   Basophils Relative 1 %   Basophils Absolute 0.1 0.0 - 0.1 K/uL  Comprehensive metabolic panel     Status: Abnormal   Collection Time: 01/05/2017  3:00 PM  Result Value Ref Range   Sodium 137 135 - 145 mmol/L   Potassium 3.9 3.5 - 5.1 mmol/L   Chloride 106 101 - 111 mmol/L   CO2 18 (L) 22 - 32 mmol/L   Glucose, Bld 158 (H) 65 - 99 mg/dL   BUN 17 6 - 20 mg/dL   Creatinine, Ser 0.91 0.44 - 1.00 mg/dL   Calcium 9.4 8.9 - 10.3 mg/dL   Total Protein 7.7 6.5 - 8.1 g/dL   Albumin 3.9 3.5 - 5.0 g/dL   AST 37 15 - 41 U/L  ALT 28 14 - 54 U/L   Alkaline Phosphatase 95 38 - 126 U/L   Total Bilirubin 0.8 0.3 - 1.2 mg/dL   GFR calc non Af Amer >60 >60 mL/min   GFR calc Af Amer >60 >60 mL/min    Comment: (NOTE) The eGFR has been calculated using the CKD EPI equation. This calculation has not been validated in all clinical situations. eGFR's persistently <60 mL/min signify possible Chronic Kidney Disease.    Anion gap 13 5 - 15  I-stat troponin, ED     Status: None   Collection Time: 01/05/2017  3:14 PM  Result Value Ref Range   Troponin i, poc 0.00 0.00 - 0.08 ng/mL   Comment 3            Comment: Due to the release kinetics of cTnI, a negative result within the first hours of the onset of symptoms does not rule out myocardial infarction with certainty. If myocardial infarction is still suspected, repeat the test at appropriate intervals.   I-Stat Chem 8, ED     Status: Abnormal   Collection Time: 01/14/2017  3:15 PM  Result Value Ref Range   Sodium 139 135 - 145 mmol/L   Potassium 3.8 3.5 - 5.1 mmol/L   Chloride 106 101 - 111 mmol/L   BUN 19 6 - 20  mg/dL   Creatinine, Ser 0.70 0.44 - 1.00 mg/dL   Glucose, Bld 162 (H) 65 - 99 mg/dL   Calcium, Ion 1.13 (L) 1.15 - 1.40 mmol/L   TCO2 20 (L) 22 - 32 mmol/L   Hemoglobin 16.0 (H) 12.0 - 15.0 g/dL   HCT 47.0 (H) 36.0 - 46.0 %  POC occult blood, ED     Status: Abnormal   Collection Time: 01/31/2017  3:52 PM  Result Value Ref Range   Fecal Occult Bld POSITIVE (A) NEGATIVE    Ct Angio Head W Or Wo Contrast  Result Date: 01/14/2017 CLINICAL DATA:  Code stroke. 71 year old female code stroke, nonverbal, headache, seizure.  Last seen normal 1420 hours. EXAM: CT ANGIOGRAPHY HEAD AND NECK TECHNIQUE: Multidetector CT imaging of the head and neck was performed using the standard protocol during bolus administration of intravenous contrast. Multiplanar CT image reconstructions and MIPs were obtained to evaluate the vascular anatomy. Carotid stenosis measurements (when applicable) are obtained utilizing NASCET criteria, using the distal internal carotid diameter as the denominator. CONTRAST:  50 mL Isovue 370 COMPARISON:  Head CT without contrast 1547 hours today. FINDINGS: CTA NECK Skeleton: Absent maxillary dentition and occasional poor mandible dentition. Degenerative changes in the cervical spine. No acute osseous abnormality identified. Upper chest: Intubated. Endotracheal tube terminates above the carina. Enteric tube courses into the thoracic esophagus. Negative lung apices aside from mild scarring and emphysema. No superior mediastinal lymphadenopathy. Other neck: Fluid in the pharynx and posterior nasal cavity in the setting of intubation. Otherwise negative neck soft tissues. Aortic arch: Good contrast bolus timing. Bovine type arch configuration. Mild arch atherosclerosis. No great vessel origin stenosis. Right carotid system: Patent carotid stent from the distal right CCA to the right ICA distal to the bulb. There is mild to moderate in stent stenosis at the level of the right ICA origin and bulb due to  combined waist of the stent and some internal soft plaque (series 9, image 64). Stenosis estimated at 65% with respect to the distal vessel. Distal to the stent the cervical right ICA is negative. Left carotid system: Bovine left CCA origin with no stenosis.  Mild plaque at the left carotid bifurcation. No cervical left carotid stenosis. Vertebral arteries: No proximal right subclavian artery stenosis despite soft and calcified plaque at its origin. Normal right vertebral artery origin. Normal right vertebral artery to the skullbase. No proximal left subclavian artery stenosis despite calcified plaque. Normal origin of the dominant left vertebral artery. Normal left vertebral artery to the skullbase. CTA HEAD Posterior circulation: Dominant distal left and non dominant right vertebral arteries are normal to the vertebrobasilar junction. Patent PICA origins. Normal basilar artery. Normal SCA and PCA origins. Posterior communicating arteries are diminutive or absent. Normal PCA branches. Anterior circulation: Both ICA siphons are patent without stenosis. Mild calcified plaque on the left. Normal ophthalmic artery origins. Normal carotid termini, MCA and ACA origins. Mildly dominant left ACA A1. Anterior communicating artery and bilateral ACA branches are normal. Left MCA M1 segment, bifurcation, and left MCA branches appear normal. Right MCA M1 segment, bifurcation, and right MCA branches appear normal. Venous sinuses: Early contrast bolus timing. Superior sagittal sinus and straight sinus are patent. Anatomic variants: Dominant left vertebral artery. Bovine type aortic arch configuration. Review of the MIP images confirms the above findings IMPRESSION: 1. Negative for emergent large vessel occlusion. And no intracranial arterial stenosis or occlusion identified. 2. Positive for patent right cervical carotid stent with in-stent stenosis at the level of the right ICA origin up to 65%. 3. Negative left carotid and  bilateral vertebral arteries. 4. Intubated. ET tube terminates above the carina. Enteric tube courses into the esophagus. Preliminary report of #1 was relayed via text pager to Dr. Rogue Jury on 01/11/2017 at 1617 hours. Electronically Signed   By: Genevie Ann M.D.   On: 01/09/2017 16:26   Ct Angio Neck W Or Wo Contrast  Result Date: 01/26/2017 CLINICAL DATA:  Code stroke. 71 year old female code stroke, nonverbal, headache, seizure.  Last seen normal 1420 hours. EXAM: CT ANGIOGRAPHY HEAD AND NECK TECHNIQUE: Multidetector CT imaging of the head and neck was performed using the standard protocol during bolus administration of intravenous contrast. Multiplanar CT image reconstructions and MIPs were obtained to evaluate the vascular anatomy. Carotid stenosis measurements (when applicable) are obtained utilizing NASCET criteria, using the distal internal carotid diameter as the denominator. CONTRAST:  50 mL Isovue 370 COMPARISON:  Head CT without contrast 1547 hours today. FINDINGS: CTA NECK Skeleton: Absent maxillary dentition and occasional poor mandible dentition. Degenerative changes in the cervical spine. No acute osseous abnormality identified. Upper chest: Intubated. Endotracheal tube terminates above the carina. Enteric tube courses into the thoracic esophagus. Negative lung apices aside from mild scarring and emphysema. No superior mediastinal lymphadenopathy. Other neck: Fluid in the pharynx and posterior nasal cavity in the setting of intubation. Otherwise negative neck soft tissues. Aortic arch: Good contrast bolus timing. Bovine type arch configuration. Mild arch atherosclerosis. No great vessel origin stenosis. Right carotid system: Patent carotid stent from the distal right CCA to the right ICA distal to the bulb. There is mild to moderate in stent stenosis at the level of the right ICA origin and bulb due to combined waist of the stent and some internal soft plaque (series 9, image 64). Stenosis  estimated at 65% with respect to the distal vessel. Distal to the stent the cervical right ICA is negative. Left carotid system: Bovine left CCA origin with no stenosis. Mild plaque at the left carotid bifurcation. No cervical left carotid stenosis. Vertebral arteries: No proximal right subclavian artery stenosis despite soft and calcified plaque at its origin.  Normal right vertebral artery origin. Normal right vertebral artery to the skullbase. No proximal left subclavian artery stenosis despite calcified plaque. Normal origin of the dominant left vertebral artery. Normal left vertebral artery to the skullbase. CTA HEAD Posterior circulation: Dominant distal left and non dominant right vertebral arteries are normal to the vertebrobasilar junction. Patent PICA origins. Normal basilar artery. Normal SCA and PCA origins. Posterior communicating arteries are diminutive or absent. Normal PCA branches. Anterior circulation: Both ICA siphons are patent without stenosis. Mild calcified plaque on the left. Normal ophthalmic artery origins. Normal carotid termini, MCA and ACA origins. Mildly dominant left ACA A1. Anterior communicating artery and bilateral ACA branches are normal. Left MCA M1 segment, bifurcation, and left MCA branches appear normal. Right MCA M1 segment, bifurcation, and right MCA branches appear normal. Venous sinuses: Early contrast bolus timing. Superior sagittal sinus and straight sinus are patent. Anatomic variants: Dominant left vertebral artery. Bovine type aortic arch configuration. Review of the MIP images confirms the above findings IMPRESSION: 1. Negative for emergent large vessel occlusion. And no intracranial arterial stenosis or occlusion identified. 2. Positive for patent right cervical carotid stent with in-stent stenosis at the level of the right ICA origin up to 65%. 3. Negative left carotid and bilateral vertebral arteries. 4. Intubated. ET tube terminates above the carina. Enteric tube  courses into the esophagus. Preliminary report of #1 was relayed via text pager to Dr. Rogue Jury on 01/08/2017 at 1617 hours. Electronically Signed   By: Genevie Ann M.D.   On: 01/14/2017 16:26   Dg Chest Portable 1 View  Result Date: 01/11/2017 CLINICAL DATA:  Tube adjustment. EXAM: PORTABLE CHEST 1 VIEW COMPARISON:  Radiograph of same day. FINDINGS: The heart size and mediastinal contours are within normal limits. Distal tip of endotracheal tube is now 4 cm above the carina, in grossly good position. Nasogastric tube is seen entering stomach. No pneumothorax or pleural effusion is noted. Both lungs are clear. The visualized skeletal structures are unremarkable. IMPRESSION: Endotracheal tube is in good position status post repositioning. No acute cardiopulmonary abnormality seen. Electronically Signed   By: Marijo Conception, M.D.   On: 01/11/2017 15:42   Dg Chest Portable 1 View  Result Date: 01/09/2017 CLINICAL DATA:  Tube placement. EXAM: PORTABLE CHEST 1 VIEW COMPARISON:  None. FINDINGS: The heart size and mediastinal contours are within normal limits. Endotracheal tube tip is seen approximately 2 cm above the carina. Nasogastric tube is seen with tip in expected position of proximal stomach. No pneumothorax or pleural effusion is noted. Both lungs are clear. The visualized skeletal structures are unremarkable. IMPRESSION: Endotracheal and nasogastric tubes are in grossly good position. No acute cardiopulmonary abnormality seen. Electronically Signed   By: Marijo Conception, M.D.   On: 01/28/2017 15:39   Ct Head Code Stroke Wo Contrast  Result Date: 01/23/2017 CLINICAL DATA:  Code stroke. 71 year old female code stroke, nonverbal, headache, seizure. Last seen normal 1420 hours. EXAM: CT HEAD WITHOUT CONTRAST TECHNIQUE: Contiguous axial images were obtained from the base of the skull through the vertex without intravenous contrast. COMPARISON:  None. FINDINGS: Brain: Chronic appearing  encephalomalacia at the right operculum and insula tracking toward the superior right peri-Rolandic cortex. Mild asymmetric enlargement of the right lateral ventricle. Elsewhere confluent bilateral abnormal cerebral white matter hypodensity. Confluent acute to subacute appearing hypodensity throughout the right basal ganglia (series 3, image 16). Patchy heterogeneous hypodensity throughout the bilateral thalami. No acute intracranial hemorrhage identified. No midline shift, mass effect, or  evidence of intracranial mass lesion. No other cytotoxic edema identified. Vascular: Calcified atherosclerosis at the skull base. Skull: Negative. Sinuses/Orbits: Intubated on the scout view. Fluid in the pharynx and nasal cavity. Small fluid levels in both sphenoid sinuses. Otherwise well pneumatized. Other: Visualized orbits and scalp soft tissues are within normal limits. ASPECTS ([place] Stroke Program Early CT Score) - Ganglionic level infarction (caudate, lentiform nuclei, internal capsule, insula, M1-M3 cortex): 4 (right insula, M1 and M2 thought to be chronic) - Supraganglionic infarction (M4-M6 cortex): 3 ( right M 5 thought to be chronic) Total score (0-10 with 10 being normal): 7 IMPRESSION: 1. Mix of acute/subacute and chronic ischemia suspected in the right MCA territory. No associated hemorrhage or mass effect. 2. ASPECTS is 7 3. Underlying chronic small vessel disease. 4. The above was relayed via text pager to Dr. Alessandra Bevels on 01/19/2017 at 15:50 . Electronically Signed   By: Genevie Ann M.D.   On: 01/08/2017 15:50    ROS Blood pressure (!) 186/97, pulse 90, temperature (S) 97.7 F (36.5 C), temperature source Rectal, resp. rate 14, height _0  (1.499 m), weight 47 kg (103 lb 9.9 oz), SpO2 100 %. Neurologic Examination:  Intubated, unresponsive- predated the intubation and sedation. PERL. No eye deviation. Face symmetric. No tongue bite. Positive bowel incontinence.  Withdraws R to pain, non on the  L. Babinski Left.   Assessment/Plan:  Focal onset seizures with secondary generalization emanating from the prior right frontal ischemic stroke area based on semiology of the events.  She is now in post-ictal state.  No IV tPA since I do not suspect an acute stroke.  No indication for mechanical thrombectomy since no LVO.    Will admit to ICU: 1. EEG tomorrow - except patient to sleep for rest of the day 2. Vimpat 100 mg IV q12h.  Will change to PO when awake and extubated. 3. If neurological exam is not all explained by prior right MCA infarct upon awakening, then will consider MRI Brain. 4. Please keep sedation off completely or at very minimal amounts so she can wake up and be assessed more objectively.    Rogue Jury, MD 01/08/2017, 4:34 PM

## 2017-01-16 NOTE — Progress Notes (Signed)
Family members in support of patient are Dois Davenport (daughter) and her spouse.... Chaplain greeted family in the general population waiting area and escorted them to Consultation Room A. Chaplain alerted nursing station that patient/s family is in Consultation Room A and is awaiting briefing from representative of medical team.

## 2017-01-16 NOTE — ED Notes (Signed)
Report to RN on 4N.

## 2017-01-16 NOTE — Procedures (Signed)
Extubation Procedure Note  Patient Details:   Name: Sandra Johnston DOB: Dec 29, 1945 MRN: [id number]   Airway Documentation:  Airway 7.5 mm (Active)  Secured at (cm) 21 cm 01/15/2017  6:44 PM  Measured From Lips 01/21/2017  6:44 PM  Secured Location Center 01/22/2017  6:44 PM  Secured By [employer] 01/31/2017  6:44 PM  Tube Holder Repositioned Yes 02/01/2017  6:44 PM  Cuff Pressure (cm H2O) 28 cm H2O 02/01/2017  6:44 PM  Site Condition Dry 01/23/2017  6:44 PM    Evaluation  O2 sats: stable throughout Complications: No apparent complications Patient did tolerate procedure well. Bilateral Breath Sounds: Clear, Diminished   Yes  Alvis Lemmings 01/15/2017, 7:07 PM

## 2017-01-16 NOTE — Code Documentation (Signed)
71 y.o. female with a history of CVA, epilepsy since childhood, HLD, and Alzheimer's dementia who was stated to have been in her normal state of health today prior to having a witnessed acute onset of  confusion, reduced responsiveness, eye deviation to the left, and left arm and leg tonic clonic activity. EMS was notified and reports several generalized seizure like activity episodes and several episodes of emesis. EMS reports giving 2.5mg  of Versed en route and respiratory rate of 6 requiring assisted ventilation. On arrival to [facility] ED, the patient was taken directly to Trauma room C for assessment. On assessment, patient is unresponsive, not withdrawing from pain on the left, observed spontaneous movement on the right and incontinent of bowel. NIHSS 30. VAN (+). See EMR for NIHSS and code stroke times. Patient was nonresponsive and did not appear to be protecting her airway. EDP intubated patient prior to CT. CT revealed a mix of acute/subacute and chronic ischemia suspected in the right MCA territory. ASPECTS 7. CTA revealed no LVO. IV tPA not given d/t stroke not being suspected. Not a thrombectomy candidate d/t no LVO. ED bedside handoff with ED RN Clydie Braun

## 2017-01-16 NOTE — Progress Notes (Signed)
Patient transported to 4N-21 from ED without incident.

## 2017-01-16 NOTE — Progress Notes (Signed)
STAT LTM started 

## 2017-01-17 ENCOUNTER — Inpatient Hospital Stay (HOSPITAL_COMMUNITY): Payer: Medicare (Managed Care)

## 2017-01-17 ENCOUNTER — Encounter (HOSPITAL_COMMUNITY): Payer: Self-pay | Admitting: *Deleted

## 2017-01-17 DIAGNOSIS — R569 Unspecified convulsions: Secondary | ICD-10-CM

## 2017-01-17 LAB — BASIC METABOLIC PANEL
ANION GAP: 13 (ref 5–15)
BUN: 16 mg/dL (ref 6–20)
CALCIUM: 9.3 mg/dL (ref 8.9–10.3)
CHLORIDE: 105 mmol/L (ref 101–111)
CO2: 20 mmol/L — ABNORMAL LOW (ref 22–32)
Creatinine, Ser: 0.97 mg/dL (ref 0.44–1.00)
GFR calc Af Amer: >60 mL/min (ref >60)
GFR, EST NON AFRICAN AMERICAN: 57 mL/min — AB (ref >60)
GLUCOSE: 146 mg/dL — AB (ref 65–99)
POTASSIUM: 3.6 mmol/L (ref 3.5–5.1)
Sodium: 138 mmol/L (ref 135–145)

## 2017-01-17 LAB — GLUCOSE, CAPILLARY
GLUCOSE-CAPILLARY: 127 mg/dL — AB (ref 65–99)
GLUCOSE-CAPILLARY: 128 mg/dL — AB (ref 65–99)
GLUCOSE-CAPILLARY: 79 mg/dL (ref 65–99)
GLUCOSE-CAPILLARY: 96 mg/dL (ref 65–99)
Glucose-Capillary: 100 mg/dL — ABNORMAL HIGH (ref 65–99)
Glucose-Capillary: 87 mg/dL (ref 65–99)

## 2017-01-17 LAB — CBC
HCT: 40.9 % (ref 36.0–46.0)
HEMOGLOBIN: 13.2 g/dL (ref 12.0–15.0)
MCH: 28.7 pg (ref 26.0–34.0)
MCHC: 32.3 g/dL (ref 30.0–36.0)
MCV: 88.9 fL (ref 78.0–100.0)
Platelets: 789 10*3/uL — ABNORMAL HIGH (ref 150–400)
RBC: 4.6 MIL/uL (ref 3.87–5.11)
RDW: 15.6 % — ABNORMAL HIGH (ref 11.5–15.5)
WBC: 20.1 10*3/uL — AB (ref 4.0–10.5)

## 2017-01-17 LAB — POCT I-STAT 3, ART BLOOD GAS (G3+)
Acid-Base Excess: 1 mmol/L (ref 0.0–2.0)
BICARBONATE: 26 mmol/L (ref 20.0–28.0)
O2 Saturation: 99 %
PO2 ART: 125 mmHg — AB (ref 83.0–108.0)
Patient temperature: 98.2
TCO2: 27 mmol/L (ref 22–32)
pCO2 arterial: 40.8 mmHg (ref 32.0–48.0)
pH, Arterial: 7.411 (ref 7.350–7.450)

## 2017-01-17 LAB — TROPONIN I: Troponin I: 0.03 ng/mL (ref <0.03)

## 2017-01-17 MED ORDER — ORAL CARE MOUTH RINSE
15.0000 mL | Freq: Two times a day (BID) | OROMUCOSAL | Status: DC
  Administered 2017-01-17 – 2017-01-18 (×4): 15 mL via OROMUCOSAL

## 2017-01-17 MED ORDER — DEXTROSE-NACL 5-0.9 % IV SOLN
INTRAVENOUS | Status: DC
  Administered 2017-01-17 – 2017-01-22 (×7): via INTRAVENOUS

## 2017-01-17 MED ORDER — POTASSIUM CHLORIDE 10 MEQ/100ML IV SOLN
10.0000 meq | INTRAVENOUS | Status: AC
  Administered 2017-01-17 (×4): 10 meq via INTRAVENOUS
  Filled 2017-01-17 (×4): qty 100

## 2017-01-17 MED ORDER — CHLORHEXIDINE GLUCONATE 0.12 % MT SOLN
15.0000 mL | Freq: Two times a day (BID) | OROMUCOSAL | Status: DC
  Administered 2017-01-17 – 2017-01-19 (×5): 15 mL via OROMUCOSAL
  Filled 2017-01-17 (×3): qty 15

## 2017-01-17 NOTE — Progress Notes (Addendum)
Subjective: Patient is nonvocal  Exam: Vitals:   01/17/17 0900 01/17/17 1000  BP: (!) 157/81 (!) 98/53  Pulse: (!) 106 (!) 101  Resp: (!) 21 17  Temp:    SpO2: 100% 100%    HEENT-  Normocephalic, no lesions, without obvious abnormality.  Normal external eye and conjunctiva.  Normal TM's bilaterally.  Normal auditory canals and external ears. Normal external nose, mucus membranes and septum.  Normal pharynx. Cardiovascular- S1, S2 normal, pulses palpable throughout   Lungs- no tachypnea, retractions or cyanosis, Heart exam - S1, S2 normal, no murmur, no gallop, rate regular Abdomen- normal findings: bowel sounds normal    Neuro:  CN: Pupils are equal and round. They are symmetrically reactive from 3-->2 mm. EOMI without nystagmus. Facial sensation is intact to light touch. Face with the left facial droop. Nonverbal but response to noxious stimuli.   Motor: moving right arm and leg greater than left spontaneously and to noxious stimuli. Has increased tone on left arm.  Sensation: withdraws to pain bilaterally  DTRs: 2+, symmetric  Toes pgoing bilaterally. No pathologic reflexes.   Pertinent Labs/Diagnostics: Awaiting LTM reading WBC 20.1   Felicie Morn PA-C [facility] [phone]  Attending addendum Patient seen and examined. Agree with the history and physical above.  Impression: Focal onset seizure with secondary generalization - most likely from the underlying prior right frontal ischemic stroke.   Recs: Continue with Vimpat 100 mg IV every 12 hours, Keppra  750 mg IV twice a day.  Currently on LTM EEG. Awaiting reading to make further recommendations Re: Continuation or discontinuation of long-term EEG monitoring.  01/17/2017, 10:07 AM  -- Milon Dikes, MD [facility] [phone]  If 7pm to 7am, please call on call as listed on AMION.   ADDENDUM MRI after EEG leads can be removed (pending official EEG read)

## 2017-01-17 NOTE — Procedures (Signed)
  Electroencephalogram report- LTM   Data acquisition: 10-20 electrode placement.  Additional T1, T2, and EKG electrodes; 26 channel digital referential acquisition reformatted to 18 channel/7 channel coronal bipolar     Beginning time: 10/14 18 at 10 51 am  Ending time:10/15 18 at 08 23 am   CPT: 95951 Day of study: day 1   This 24 hours of intensive EEG monitoring with simultaneous video monitoring was performed for this patient with Seizures.   Background activities marked by obvious interhemispheric asymmetries where there is a persistent continuous low amplitude delta slowing across right hemisphere.  Occasionally there is a poorly formed right frontal sharp waves present.  There was no clinical subclinical seizures.  Across left hemisphere there is also background slowing noted however 7-8 cps posterior dominant waking rhythm noted across left hemisphere.  Clinical interpretation: This 24 hours of intensive EEG monitoring with simultaneous video monitoring did not record any clinical subclinical seizures.  Background activity is abnormal due to persistent right hemispheric low amplitude delta slowing and poorly formed right frontal sharp waves.  These findings suggestive of neuronal dysfunction and some degree of cortical irritability in the right frontal region.  Clinical correlation is advised.

## 2017-01-17 NOTE — Progress Notes (Signed)
LTM EEG discontinued.  

## 2017-01-17 NOTE — Progress Notes (Signed)
Called Dr Wilford Corner. Pt may be d/c from EEG. MRI ordered. Dr Wilford Corner will come update family when able to leave the ED.

## 2017-01-17 NOTE — Progress Notes (Signed)
LTM checked, all imp below 5K ohms, no skin breakdown noted 

## 2017-01-17 NOTE — Progress Notes (Signed)
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 Now admitted with acute SZ. Extubated 10/14 evening. Then overnight she had a seizure of approx 35 min duration which finally stopped after  Ativan. Patient was loaded with Keppra and continued on Vimpat. However overnight she has not regained her prior mental status since this most recent seizure. Continuous EEG is in process.   PAST MEDICAL HISTORY :  She  has a past medical history of Stroke [facility]).  PAST SURGICAL HISTORY: She  has no past surgical history on file.  No Known Allergies  No current facility-administered medications on file prior to encounter.    No current outpatient prescriptions on file prior to encounter.    FAMILY HISTORY:  Her has no family status information on file.   SOCIAL HISTORY: She  uses tobacco; No history of Etoh use   REVIEW OF SYSTEMS:   Review of Systems  Unable to perform ROS: Mental status change   SUBJECTIVE:  Somnolent, unable to answer questions.   VITAL SIGNS: BP (!) 109/57   Pulse 99   Temp 98.2 F (36.8 C)   Resp 15   Ht  (1.499 m)   Wt 46.3 kg (102 lb 1.2 oz)   LMP  (LMP Unknown)   SpO2 100%   BMI 20.62 kg/m   VENTILATOR SETTINGS: Vent Mode: PSV;CPAP FiO2 (%):  [40 %-100 %] 40 % Set Rate:  [14 bmp] 14 bmp Vt Set:  [350 mL-550 mL] 350 mL PEEP:  [5 cmH20] 5 cmH20 Pressure Support:  [10 cmH20] 10 cmH20 Plateau Pressure:  [13 cmH20-14 cmH20] 13 cmH20  INTAKE / OUTPUT: I/O last 3 completed shifts: In: 125 [I.V.:125] Out: -   PHYSICAL EXAMINATION: General: Thin elderly female, lying in ICU bed,  critically ill Neuro: No response to voice or touch, Grimace to sternal rub. PERRL HEENT: OP clear, MM moist Cardiovascular: RRR no m/r/g Lungs: CTA b/l Abdomen: Soft NTND, BS+ Musculoskeletal: no LE edema Skin: no rashes   LABS:  BMET  Recent Labs Lab 01/20/2017 1500 01/14/2017 1515 01/17/17 0517  NA 137 139 138  K 3.9 3.8 3.6  CL 106 106 105  CO2 18*  --  20*  BUN CREATININE 0.91 0.70 0.97  GLUCOSE 158* 162* 146*    Electrolytes  Recent Labs Lab 01/10/2017 1500 01/08/2017 2147 01/17/17 0517  CALCIUM 9.4  --  9.3  MG  --  1.8  --   PHOS  --  2.9  --     CBC  Recent Labs Lab 01/19/2017 1500 01/09/2017 1515 01/17/17 0517  WBC 17.1*  --  20.1*  HGB 14.2 16.0* 13.2  HCT 43.9 47.0* 40.9  PLT 870*  --  789*    Coag's  Recent Labs Lab 01/22/2017 1500  APTT 28  INR 1.04    Sepsis Markers  Recent Labs Lab 01/05/2017 1824 01/20/2017 2147  LATICACIDVEN 3.2* 1.5  PROCALCITON <0.10  --     ABG  Recent Labs Lab 01/17/2017 1636  PHART 7.503*  PCO2ART 33.7  PO2ART 496.0*    Liver Enzymes  Recent Labs Lab  01/17/2017 1500  AST 37  ALT 28  ALKPHOS 95  BILITOT 0.8  ALBUMIN 3.9    Cardiac Enzymes  Recent Labs Lab 01/07/2017 1824 01/28/2017 2147 01/17/17 0517  TROPONINI <0.03 <0.03 0.03*    Glucose  Recent Labs Lab 02/01/2017 1956 01/10/2017 2329 01/17/17 0348 01/17/17 0758 01/17/17 1114  GLUCAP 117* 111* 127* 128* 100*    Imaging Ct Angio Head W Or Wo Contrast  Result Date: 01/13/2017 CLINICAL DATA:  Code stroke. 71 year old female code stroke, nonverbal, headache, seizure.  Last seen normal 1420 hours. EXAM: CT ANGIOGRAPHY HEAD AND NECK TECHNIQUE: Multidetector CT imaging of the head and neck was performed using the standard protocol during bolus administration of intravenous contrast. Multiplanar CT image reconstructions and MIPs were obtained to evaluate the vascular anatomy. Carotid stenosis measurements (when applicable) are obtained  utilizing NASCET criteria, using the distal internal carotid diameter as the denominator. CONTRAST:  50 mL Isovue 370 COMPARISON:  Head CT without contrast 1547 hours today. FINDINGS: CTA NECK Skeleton: Absent maxillary dentition and occasional poor mandible dentition. Degenerative changes in the cervical spine. No acute osseous abnormality identified. Upper chest: Intubated. Endotracheal tube terminates above the carina. Enteric tube courses into the thoracic esophagus. Negative lung apices aside from mild scarring and emphysema. No superior mediastinal lymphadenopathy. Other neck: Fluid in the pharynx and posterior nasal cavity in the setting of intubation. Otherwise negative neck soft tissues. Aortic arch: Good contrast bolus timing. Bovine type arch configuration. Mild arch atherosclerosis. No great vessel origin stenosis. Right carotid system: Patent carotid stent from the distal right CCA to the right ICA distal to the bulb. There is mild to moderate in stent stenosis at the level of the right ICA origin and bulb due to combined waist of the stent and some internal soft plaque (series 9, image 64). Stenosis estimated at 65% with respect to the distal vessel. Distal to the stent the cervical right ICA is negative. Left carotid system: Bovine left CCA origin with no stenosis. Mild plaque at the left carotid bifurcation. No cervical left carotid stenosis. Vertebral arteries: No proximal right subclavian artery stenosis despite soft and calcified plaque at its origin. Normal right vertebral artery origin. Normal right vertebral artery to the skullbase. No proximal left subclavian artery stenosis despite calcified plaque. Normal origin of the dominant left vertebral artery. Normal left vertebral artery to the skullbase. CTA HEAD Posterior circulation: Dominant distal left and non dominant right vertebral arteries are normal to the vertebrobasilar junction. Patent PICA origins. Normal basilar artery. Normal SCA and  PCA origins. Posterior communicating arteries are diminutive or absent. Normal PCA branches. Anterior circulation: Both ICA siphons are patent without stenosis. Mild calcified plaque on the left. Normal ophthalmic artery origins. Normal carotid termini, MCA and ACA origins. Mildly dominant left ACA A1. Anterior communicating artery and bilateral ACA branches are normal. Left MCA M1 segment, bifurcation, and left MCA branches appear normal. Right MCA M1 segment, bifurcation, and right MCA branches appear normal. Venous sinuses: Early contrast bolus timing. Superior sagittal sinus and straight sinus are patent. Anatomic variants: Dominant left vertebral artery. Bovine type aortic arch configuration. Review of the MIP images confirms the above findings IMPRESSION: 1. Negative for emergent large vessel occlusion. And no intracranial arterial stenosis or occlusion identified. 2. Positive for patent right cervical carotid stent with in-stent stenosis at the level of the right ICA origin up to 65%. 3. Negative left carotid and bilateral vertebral arteries. 4. Intubated. ET tube terminates above the carina. Enteric tube  courses into the esophagus. Preliminary report of #1 was relayed via text pager to Dr. Weston Settle on 17-Jan-2017 at 1617 hours. Electronically Signed   By: Odessa Fleming M.D.   On: 2017/01/17 16:26   Ct Angio Neck W Or Wo Contrast  Result Date: 01-17-2017 CLINICAL DATA:  Code stroke. 71 year old female code stroke, nonverbal, headache, seizure.  Last seen normal 1420 hours. EXAM: CT ANGIOGRAPHY HEAD AND NECK TECHNIQUE: Multidetector CT imaging of the head and neck was performed using the standard protocol during bolus administration of intravenous contrast. Multiplanar CT image reconstructions and MIPs were obtained to evaluate the vascular anatomy. Carotid stenosis measurements (when applicable) are obtained utilizing NASCET criteria, using the distal internal carotid diameter as the denominator.  CONTRAST:  50 mL Isovue 370 COMPARISON:  Head CT without contrast 1547 hours today. FINDINGS: CTA NECK Skeleton: Absent maxillary dentition and occasional poor mandible dentition. Degenerative changes in the cervical spine. No acute osseous abnormality identified. Upper chest: Intubated. Endotracheal tube terminates above the carina. Enteric tube courses into the thoracic esophagus. Negative lung apices aside from mild scarring and emphysema. No superior mediastinal lymphadenopathy. Other neck: Fluid in the pharynx and posterior nasal cavity in the setting of intubation. Otherwise negative neck soft tissues. Aortic arch: Good contrast bolus timing. Bovine type arch configuration. Mild arch atherosclerosis. No great vessel origin stenosis. Right carotid system: Patent carotid stent from the distal right CCA to the right ICA distal to the bulb. There is mild to moderate in stent stenosis at the level of the right ICA origin and bulb due to combined waist of the stent and some internal soft plaque (series 9, image 64). Stenosis estimated at 65% with respect to the distal vessel. Distal to the stent the cervical right ICA is negative. Left carotid system: Bovine left CCA origin with no stenosis. Mild plaque at the left carotid bifurcation. No cervical left carotid stenosis. Vertebral arteries: No proximal right subclavian artery stenosis despite soft and calcified plaque at its origin. Normal right vertebral artery origin. Normal right vertebral artery to the skullbase. No proximal left subclavian artery stenosis despite calcified plaque. Normal origin of the dominant left vertebral artery. Normal left vertebral artery to the skullbase. CTA HEAD Posterior circulation: Dominant distal left and non dominant right vertebral arteries are normal to the vertebrobasilar junction. Patent PICA origins. Normal basilar artery. Normal SCA and PCA origins. Posterior communicating arteries are diminutive or absent. Normal PCA  branches. Anterior circulation: Both ICA siphons are patent without stenosis. Mild calcified plaque on the left. Normal ophthalmic artery origins. Normal carotid termini, MCA and ACA origins. Mildly dominant left ACA A1. Anterior communicating artery and bilateral ACA branches are normal. Left MCA M1 segment, bifurcation, and left MCA branches appear normal. Right MCA M1 segment, bifurcation, and right MCA branches appear normal. Venous sinuses: Early contrast bolus timing. Superior sagittal sinus and straight sinus are patent. Anatomic variants: Dominant left vertebral artery. Bovine type aortic arch configuration. Review of the MIP images confirms the above findings IMPRESSION: 1. Negative for emergent large vessel occlusion. And no intracranial arterial stenosis or occlusion identified. 2. Positive for patent right cervical carotid stent with in-stent stenosis at the level of the right ICA origin up to 65%. 3. Negative left carotid and bilateral vertebral arteries. 4. Intubated. ET tube terminates above the carina. Enteric tube courses into the esophagus. Preliminary report of #1 was relayed via text pager to Dr. Weston Settle on 01/17/17 at 1617 hours. Electronically Signed   By: Rexene Edison  Margo Aye M.D.   On: Feb 09, 2017 16:26   Dg Chest Port 1 View  Result Date: 01/17/2017 CLINICAL DATA:  Initial evaluation for acute seizures. EXAM: PORTABLE CHEST 1 VIEW COMPARISON:  Prior radiograph from February 09, 2017. FINDINGS: Endotracheal and enteric tubes have been removed. Stable cardiac and mediastinal silhouettes. Lungs mildly hypoinflated. Mild bibasilar subsegmental atelectasis. No new focal infiltrates. No pulmonary edema or pleural effusion. No pneumothorax. No acute osseus abnormality. IMPRESSION: 1. Interval extubation. 2. Shallow lung inflation with mild bibasilar atelectasis. No other active cardiopulmonary disease. Electronically Signed   By: Rise Mu M.D.   On: 01/17/2017 06:04   Dg Chest Portable 1  View  Result Date: 02/09/17 CLINICAL DATA:  Tube adjustment. EXAM: PORTABLE CHEST 1 VIEW COMPARISON:  Radiograph of same day. FINDINGS: The heart size and mediastinal contours are within normal limits. Distal tip of endotracheal tube is now 4 cm above the carina, in grossly good position. Nasogastric tube is seen entering stomach. No pneumothorax or pleural effusion is noted. Both lungs are clear. The visualized skeletal structures are unremarkable. IMPRESSION: Endotracheal tube is in good position status post repositioning. No acute cardiopulmonary abnormality seen. Electronically Signed   By: Lupita Raider, M.D.   On: 02/09/2017 15:42   Dg Chest Portable 1 View  Result Date: Feb 09, 2017 CLINICAL DATA:  Tube placement. EXAM: PORTABLE CHEST 1 VIEW COMPARISON:  None. FINDINGS: The heart size and mediastinal contours are within normal limits. Endotracheal tube tip is seen approximately 2 cm above the carina. Nasogastric tube is seen with tip in expected position of proximal stomach. No pneumothorax or pleural effusion is noted. Both lungs are clear. The visualized skeletal structures are unremarkable. IMPRESSION: Endotracheal and nasogastric tubes are in grossly good position. No acute cardiopulmonary abnormality seen. Electronically Signed   By: Lupita Raider, M.D.   On: February 09, 2017 15:39   Ct Head Code Stroke Wo Contrast  Result Date: 2017/02/09 CLINICAL DATA:  Code stroke. 71 year old female code stroke, nonverbal, headache, seizure. Last seen normal 1420 hours. EXAM: CT HEAD WITHOUT CONTRAST TECHNIQUE: Contiguous axial images were obtained from the base of the skull through the vertex without intravenous contrast. COMPARISON:  None. FINDINGS: Brain: Chronic appearing encephalomalacia at the right operculum and insula tracking toward the superior right peri-Rolandic cortex. Mild asymmetric enlargement of the right lateral ventricle. Elsewhere confluent bilateral abnormal cerebral white matter  hypodensity. Confluent acute to subacute appearing hypodensity throughout the right basal ganglia (series 3, image 16). Patchy heterogeneous hypodensity throughout the bilateral thalami. No acute intracranial hemorrhage identified. No midline shift, mass effect, or evidence of intracranial mass lesion. No other cytotoxic edema identified. Vascular: Calcified atherosclerosis at the skull base. Skull: Negative. Sinuses/Orbits: Intubated on the scout view. Fluid in the pharynx and nasal cavity. Small fluid levels in both sphenoid sinuses. Otherwise well pneumatized. Other: Visualized orbits and scalp soft tissues are within normal limits. ASPECTS (Alberta Stroke Program Early CT Score) - Ganglionic level infarction (caudate, lentiform nuclei, internal capsule, insula, M1-M3 cortex): 4 (right insula, M1 and M2 thought to be chronic) - Supraganglionic infarction (M4-M6 cortex): 3 ( right M 5 thought to be chronic) Total score (0-10 with 10 being normal): 7 IMPRESSION: 1. Mix of acute/subacute and chronic ischemia suspected in the right MCA territory. No associated hemorrhage or mass effect. 2. ASPECTS is 7 3. Underlying chronic small vessel disease. 4. The above was relayed via text pager to Dr. Johnn Hai on 2017-02-09 at 15:50 . Electronically Signed   By: Althea Grimmer.D.  On: 2017-01-18 15:50   STUDIES:  Ct Head Code Stroke Wo Contrast(2017-01-18):  IMPRESSION: 1. Mix of acute/subacute and chronic ischemia suspected in the right MCA territory. No associated hemorrhage or mass effect. 2. ASPECTS is 7 3. Underlying chronic small vessel disease.  CTA Head/Neck (10/14):  1. Negative for emergent large vessel occlusion. And no intracranial arterial stenosis or occlusion identified. 2. Positive for patent right cervical carotid stent with in-stent stenosis at the level of the right ICA origin up to 65%. 3. Negative left carotid and bilateral vertebral arteries. 4. Intubated. ET tube terminates above the carina.  Enteric tube courses into the esophagus.  CXR (10/15):  1. Interval extubation. 2. Shallow lung inflation with mild bibasilar atelectasis. No other active cardiopulmonary disease.   SIGNIFICANT EVENTS: Seizure 10/14 PM  LINES/TUBES: 18G and 20G PIV's ETT 10/14 >>10/14  ASSESSMENT / PLAN: 71 year old with history of seizure disorder, Alzheimer's, prior CVA with left hemiparesis admitted with seizure activity, intubated due to poor mental status and inability to protect airway  PULMONARY No active issues; extubated 10/14. Due to significant somnolence will check ABG to rule out hypercapnea.  CXR on my review showed only very minimal basilar atelectasis.   CARDIOVASCULAR No active issues   RENAL 1. Hypokalemia: - will replete with 40 MEQ KCL IV - Follow urine output and cr  GASTROINTESTINAL No active issues; NPO given AMS  HEMATOLOGIC 1. Leukocytosis:  - CXR shows no infiltrate; Procal and lactate on 10/14 were both normal.  - agree with checking UA, however in setting of being afebrile would not pursue blood cultures or empiric antibiotics as this is likely just from stress response.   INFECTIOUS No evidence of infection  ENDOCRINE 1. Hyperglycemia   - Continue SSI coverage; NPO  NEUROLOGIC 1. Seizures; Prior stoke; Alzheimer's demetia - continuous EEG in progress - continue Keppra and Vimpat; Ativan PRN - check ABG given the continued somnolence; would not expect her to still be post-ictal 28 hours post seizure.  - defer to Neurology regarding if repeat imaging is needed.    FAMILY  - Updated family at bedside.   40 minutes critical care time  Milana Obey, MD Pulmonary and Critical Care Medicine [facility] Pager: [phone]  01/17/2017, 11:56 AM

## 2017-01-17 NOTE — Progress Notes (Signed)
eLink Physician-Brief Progress Note Patient Name: Sandra Johnston DOB: [birth year] MRN: [id number]   Date of Service  01/17/2017  HPI/Events of Note  Hypoglycemia - Blood glucose = 128 --> 100 --> 87 --> 79.  eICU Interventions  Will change IV fluid to D5 0.9 NaCl to run at 50 mL/hour.     Intervention Category Major Interventions: Other:  Sandra Johnston 01/17/2017, 7:58 PM

## 2017-01-18 DIAGNOSIS — R4182 Altered mental status, unspecified: Secondary | ICD-10-CM

## 2017-01-18 LAB — BASIC METABOLIC PANEL
ANION GAP: 5 (ref 5–15)
BUN: 16 mg/dL (ref 6–20)
CHLORIDE: 112 mmol/L — AB (ref 101–111)
CO2: 22 mmol/L (ref 22–32)
Calcium: 9.1 mg/dL (ref 8.9–10.3)
Creatinine, Ser: 0.79 mg/dL (ref 0.44–1.00)
Glucose, Bld: 89 mg/dL (ref 65–99)
POTASSIUM: 3.7 mmol/L (ref 3.5–5.1)
SODIUM: 139 mmol/L (ref 135–145)

## 2017-01-18 LAB — GLUCOSE, CAPILLARY
GLUCOSE-CAPILLARY: 88 mg/dL (ref 65–99)
GLUCOSE-CAPILLARY: 77 mg/dL (ref 65–99)
GLUCOSE-CAPILLARY: 96 mg/dL (ref 65–99)
GLUCOSE-CAPILLARY: 108 mg/dL — AB (ref 65–99)
Glucose-Capillary: 87 mg/dL (ref 65–99)
Glucose-Capillary: 107 mg/dL — ABNORMAL HIGH (ref 65–99)

## 2017-01-18 LAB — URINALYSIS, ROUTINE W REFLEX MICROSCOPIC
BILIRUBIN URINE: NEGATIVE
Glucose, UA: NEGATIVE mg/dL
KETONES UR: 5 mg/dL — AB
LEUKOCYTES UA: NEGATIVE
Nitrite: NEGATIVE
PH: 5 (ref 5.0–8.0)
PROTEIN: NEGATIVE mg/dL
SQUAMOUS EPITHELIAL / LPF: NONE SEEN
Specific Gravity, Urine: 1.021 (ref 1.005–1.030)

## 2017-01-18 LAB — CBC WITH DIFFERENTIAL/PLATELET
BASOS ABS: 0 10*3/uL (ref 0.0–0.1)
Basophils Relative: 0 %
EOS PCT: 2 %
Eosinophils Absolute: 0.2 10*3/uL (ref 0.0–0.7)
HCT: 36.8 % (ref 36.0–46.0)
Hemoglobin: 11.7 g/dL — ABNORMAL LOW (ref 12.0–15.0)
LYMPHS PCT: 23 %
Lymphs Abs: 2.2 10*3/uL (ref 0.7–4.0)
MCH: 28.6 pg (ref 26.0–34.0)
MCHC: 31.8 g/dL (ref 30.0–36.0)
MCV: 90 fL (ref 78.0–100.0)
MONO ABS: 0.5 10*3/uL (ref 0.1–1.0)
Monocytes Relative: 6 %
NEUTROS PCT: 69 %
Neutro Abs: 6.6 10*3/uL (ref 1.7–7.7)
PLATELETS: 668 10*3/uL — AB (ref 150–400)
RBC: 4.09 MIL/uL (ref 3.87–5.11)
RDW: 15.8 % — AB (ref 11.5–15.5)
WBC: 9.6 10*3/uL (ref 4.0–10.5)

## 2017-01-18 LAB — RAPID URINE DRUG SCREEN, HOSP PERFORMED
Amphetamines: NOT DETECTED
BENZODIAZEPINES: POSITIVE — AB
Barbiturates: NOT DETECTED
COCAINE: NOT DETECTED
OPIATES: NOT DETECTED
Tetrahydrocannabinol: NOT DETECTED

## 2017-01-18 LAB — MAGNESIUM: Magnesium: 1.9 mg/dL (ref 1.7–2.4)

## 2017-01-18 LAB — PHOSPHORUS: Phosphorus: 2.5 mg/dL (ref 2.5–4.6)

## 2017-01-18 MED ORDER — ACETAMINOPHEN 325 MG PO TABS
650.0000 mg | ORAL_TABLET | Freq: Four times a day (QID) | ORAL | Status: DC | PRN
  Administered 2017-01-18 – 2017-01-22 (×6): 650 mg via ORAL
  Filled 2017-01-18 (×7): qty 2

## 2017-01-18 MED ORDER — LORAZEPAM 2 MG/ML IJ SOLN
1.0000 mg | Freq: Once | INTRAMUSCULAR | Status: DC
  Administered 2017-01-19: 1 mg via INTRAVENOUS
  Filled 2017-01-18: qty 1

## 2017-01-18 NOTE — Progress Notes (Signed)
Was given a 14:30 MRI time this morning. At 1600 they called back saying it would be 17:30. They are not here as of now (18:15).

## 2017-01-18 NOTE — Evaluation (Signed)
Clinical/Bedside Swallow Evaluation Patient Details  Name: Sandra Johnston MRN: [id number] Date of Birth: 1946-02-22  Today's Date: 01/18/2017 Time: SLP Start Time (ACUTE ONLY): 1501 SLP Stop Time (ACUTE ONLY): 1518 SLP Time Calculation (min) (ACUTE ONLY): 17 min  Past Medical History:  Past Medical History:  Diagnosis Date  . Stroke [facility])    Past Surgical History: History reviewed. No pertinent surgical history. HPI:  Focal onset seizure with secondary generalization - most likely from the underlying prior right frontal ischemic stroke. Intubated for airway protection but extubated same day.    Assessment / Plan / Recommendation Clinical Impression  Patient presents with a functional oropharyngeal swallow. Oral transit of bolus mildly prolonged but functional. No overt indication of aspiration observed across consistencies. No SLP f/u indicated at this time.  SLP Visit Diagnosis: Dysphagia, unspecified (R13.10)    Aspiration Risk  Mild aspiration risk    Diet Recommendation Regular;Thin liquid   Liquid Administration via: Cup;Straw Medication Administration: Whole meds with liquid Supervision: Patient able to self feed;Staff to assist with self feeding Compensations: Slow rate;Small sips/bites Postural Changes: Seated upright at 90 degrees    Other  Recommendations Oral Care Recommendations: Oral care BID   Follow up Recommendations None        Swallow Study   General HPI: Focal onset seizure with secondary generalization - most likely from the underlying prior right frontal ischemic stroke. Intubated for airway protection but extubated same day.  Type of Study: Bedside Swallow Evaluation Previous Swallow Assessment: none noted Diet Prior to this Study: NPO Temperature Spikes Noted: No Respiratory Status: Room air History of Recent Intubation: Yes Length of Intubations (days): 1 days (less than 24 hours) Date extubated: 2017/02/14 Behavior/Cognition:  Alert;Cooperative;Pleasant mood Oral Cavity Assessment: Within Functional Limits Oral Care Completed by SLP: Yes Oral Cavity - Dentition: Adequate natural dentition Vision: Functional for self-feeding Self-Feeding Abilities: Able to feed self;Needs assist Patient Positioning: Upright in bed Baseline Vocal Quality: Low vocal intensity Volitional Cough: Weak Volitional Swallow: Able to elicit    Oral/Motor/Sensory Function Overall Oral Motor/Sensory Function: Within functional limits   Ice Chips Ice chips: Not tested   Thin Liquid Thin Liquid: Within functional limits Presentation: Cup;Self Fed;Straw    Nectar Thick Nectar Thick Liquid: Not tested   Honey Thick Honey Thick Liquid: Not tested   Puree Puree: Within functional limits (mildly delayed oral transit )   Solid  Sandra Dovel MA, CCC-SLP [phone]    Solid: Within functional limits Presentation: Self Fed (mildly delayed oral transit)        Sandra Johnston Sandra Johnston 01/18/2017,3:34 PM

## 2017-01-18 NOTE — Care Management Note (Signed)
Case Management Note  Patient Details  Name: Lam Bjorklund MRN: [id number] Date of Birth: 1945/11/27  Subjective/Objective:   Pt admitted on 01/18/2017 with seizure activity.  PTA, pt was residing at home with daughter.  She had recently relocated from [place], where she was residing in a nursing facility.  She is mostly WC bound, per report.                    Action/Plan: Recommend PT/OT to evaluate LOC needed at discharge.  Will follow for discharge planning as pt progresses.  Expected Discharge Date:                  Expected Discharge Plan:  Skilled Nursing Facility  In-House Referral:  Clinical Social Work  Discharge planning Services  CM Consult  Post Acute Care Choice:    Choice offered to:     DME Arranged:    DME Agency:     HH Arranged:    HH Agency:     Status of Service:  In process, will continue to follow  If discussed at Long Length of Stay Meetings, dates discussed:    Additional Comments:  Quintella Baton, RN, BSN  Trauma/Neuro ICU Case Manager [phone]

## 2017-01-18 NOTE — Progress Notes (Signed)
eLink Physician-Brief Progress Note Patient Name: Sandra Johnston DOB: 1946/03/07 MRN: [id number]   Date of Service  01/18/2017  HPI/Events of Note  Oliguria - Bladder scan with 350 mL.  eICU Interventions  Will order: 1. I/O Cath PRN.     Intervention Category Intermediate Interventions: Oliguria - evaluation and management  Rada Zegers Eugene 01/18/2017, 12:36 AM

## 2017-01-18 NOTE — Progress Notes (Signed)
Subjective: More talkative and following commands   Exam: Vitals:   01/18/17 0800 01/18/17 0900  BP: 134/82 (!) 146/66  Pulse: 83 82  Resp: 14 12  Temp: (!) 97.4 F (36.3 C)   SpO2: 97% 97%    HEENT-  Normocephalic, no lesions, without obvious abnormality.  Normal external eye and conjunctiva.  Normal TM's bilaterally.  Normal auditory canals and external ears. Normal external nose, mucus membranes and septum.  Normal pharynx. Cardiovascular- S1, S2 normal, pulses palpable throughout   Lungs- chest clear, no wheezing, rales, normal symmetric air entry Neuro: alert and knows she is in the hospital. Able to follow commands.  CN: Pupils are equal and round. They are symmetrically reactive from 3-->2 mm. EOMI without nystagmus. Facial sensation is intact to light touch. Face left facial droop.  Hearing is intact to conversational voice.   Motor: moving right arm, left arm spastic paresis at baseline. Now moving legs spontaneously and purposefully.  Sensation: Intact to light touch.  DTRs: 2+, symmetric  Toes up going bilaterally. No pathologic reflexes.    Pertinent Labs/Diagnostics: EEG: Clinical interpretation: This 24 hours of intensive EEG monitoring with simultaneous video monitoring did not record any clinical subclinical seizures.  Background activity is abnormal due to persistent right hemispheric low amplitude delta slowing and poorly formed right frontal sharp waves.  These findings suggestive of neuronal dysfunction and some degree of cortical irritability in the right frontal region.  Clinical correlation is advised.  MRI pending  Felicie Morn PA-C [facility] [phone]  Attending addendum Seen and examined the patient at bedside. Agree with H&P above. My A&P below:  Impression:  Focal onset seizure with secondary generalization - most likely from the underlying prior right frontal ischemic stroke.  Prolonged post ictal state due to low reserve secondary  to old infarct  Recommendations: -Continue with Vimpat 100 mg BID, Keppra  750 mg BID. Can transition to PO meds once able to swallow.  -MRI brain when able -Correction of any toxic/metabolic derangements per primary team as you are.  We will follow with you once imaging becomes available. Please call with questions.  01/18/2017, 9:07 AM  Milon Dikes, MD [facility] [phone]  If 7pm to 7am, please call on call as listed on AMION.

## 2017-01-18 NOTE — Progress Notes (Signed)
eLink Physician-Brief Progress Note Patient Name: Sandra Johnston DOB: 10-20-1945 MRN: [id number]   Date of Service  01/18/2017  HPI/Events of Note  Leg pain - Request for Tylenol PO. AST and ALT both normal.   eICU Interventions  Will order: 1. Tylenol 650 mg PO Q 6 hours PRN pain.      Intervention Category Intermediate Interventions: Pain - evaluation and management  Sommer,Steven Eugene 01/18/2017, 8:01 PM

## 2017-01-18 NOTE — Progress Notes (Signed)
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 Now admitted with acute SZ. Extubated 10/14 evening. Then 10/14 overnight she had a seizure of approx 35 min duration which finally stopped after  Ativan. Patient was loaded with Keppra and continued on Vimpat. She remained very somnolent and post-ictal on 10/15. Continuous EEG showed no seizure. Today she is more awake and intermittently obeying commands but is still somnolent.   PAST MEDICAL HISTORY :  She  has a past medical history of Stroke [facility]).  PAST SURGICAL HISTORY: She  has no past surgical history on file.  No Known Allergies  No current facility-administered medications on file prior to encounter.    No current outpatient prescriptions on file prior to encounter.    FAMILY HISTORY:  Her has no family status information on file.   SOCIAL HISTORY: She  uses tobacco; No history of Etoh use   REVIEW OF SYSTEMS:   Review of Systems  Unable to perform ROS: Mental status change   SUBJECTIVE:  Somnolent, unable to answer questions.   VITAL SIGNS: BP (!) 164/83   Pulse (!) 101   Temp 99.8 F (37.7 C) (Oral)   Resp 15   Ht  (1.499 m)   Wt 44 kg (97 lb)   LMP  (LMP Unknown)   SpO2 97%   BMI 19.59 kg/m   INTAKE / OUTPUT: I/O last 3 completed shifts: In: 1195 [I.V.:675; IV Piggyback:520] Out: 1500 [Urine:1500]  PHYSICAL EXAMINATION: General: Thin elderly female, lying in ICU bed, critically ill Neuro: Lying in bed, somnolent but eyes open, obeys commands on rightbut is slow in response, flaccid on left, intermittently answering questions  verbally.  PERRL HEENT: OP clear, MM moist Cardiovascular: RRR no m/r/g Lungs: CTA b/l Abdomen: Soft NTND, BS+ Musculoskeletal: no LE edema Skin: no rashes   LABS:  BMET  Recent Labs Lab 01/06/2017 1500 01/07/2017 1515 01/17/17 0517 01/18/17 1044  NA 137 139 138 139  K 3.9 3.8 3.6 3.7  CL 106 106 105 112*  CO2 18*  --  20* 22  BUN CREATININE 0.91 0.70 0.97 0.79  GLUCOSE 158* 162* 146* 89    Electrolytes  Recent Labs Lab 01/25/2017 1500 01/13/2017 2147 01/17/17 0517 01/18/17 1044  CALCIUM 9.4  --  9.3 9.1  MG  --  1.8  --  1.9  PHOS  --  2.9  --  2.5    CBC  Recent Labs Lab 02/02/2017 1500 01/29/2017 1515 01/17/17 0517 01/18/17 1044  WBC 17.1*  --  20.1* 9.6  HGB 14.2 16.0* 13.2 11.7*  HCT 43.9 47.0* 40.9 36.8  PLT 870*  --  789* 668*    Coag's  Recent Labs Lab 01/05/2017 1500  APTT 28  INR 1.04    Sepsis Markers  Recent Labs Lab 01/15/2017 1824 02/02/2017 2147  LATICACIDVEN 3.2* 1.5  PROCALCITON <0.10  --     ABG  Recent Labs Lab 01/30/2017 1636 01/17/17 1249  PHART 7.503* 7.411  PCO2ART 33.7 40.8  PO2ART 496.0* 125.0*    Liver Enzymes  Recent Labs Lab 01/07/2017 1500  AST 37  ALT 28  ALKPHOS 95  BILITOT 0.8  ALBUMIN 3.9    Cardiac Enzymes  Recent Labs Lab 23-Jan-2017 1824 01/23/17 2147 01/17/17 0517  TROPONINI <0.03 <0.03 0.03*    Glucose  Recent Labs Lab 01/17/17 1936 01/17/17 2323 01/18/17 0327 01/18/17 0741 01/18/17 1201 01/18/17 1604  GLUCAP 79 96 88 87 77 96    Imaging No results found. STUDIES:  Ct Head Code Stroke Wo Contrast(01-23-17):  IMPRESSION: 1. Mix of acute/subacute and chronic ischemia suspected in the right MCA territory. No associated hemorrhage or mass effect. 2. ASPECTS is 7 3. Underlying chronic small vessel disease.  CTA Head/Neck (10/14):  1. Negative for emergent large vessel occlusion. And no intracranial arterial stenosis or occlusion identified. 2. Positive for patent right  cervical carotid stent with in-stent stenosis at the level of the right ICA origin up to 65%. 3. Negative left carotid and bilateral vertebral arteries. 4. Intubated. ET tube terminates above the carina. Enteric tube courses into the esophagus.  CXR (10/15):  1. Interval extubation. 2. Shallow lung inflation with mild bibasilar atelectasis. No other active cardiopulmonary disease.   SIGNIFICANT EVENTS: Seizure 10/14 PM  LINES/TUBES: 18G and 20G PIV's ETT 10/14 >>10/14  ASSESSMENT / PLAN: 71 year old with history of seizure disorder, Alzheimer's, prior CVA with left hemiparesis admitted with seizure activity, intubated due to poor mental status and inability to protect airway  PULMONARY No active issues; extubated 10/14. Somnolent on 10/15 due to post-ictal. ABG showed no hypercapnea.  CXR on my review showed only very minimal basilar atelectasis.   CARDIOVASCULAR No active issues   RENAL 1. Hypokalemia (improved): - monitor closely - Follow urine output and cr  GASTROINTESTINAL No active issues; NPO given AMS (improving but still to somnolent to safely eat). Consult Speech.   HEMATOLOGIC 1. Leukocytosis (resolved) - CXR shows no infiltrate; Procal and lactate on 10/14 were both normal.  - likely just from stress response, now resolved  INFECTIOUS No evidence of infection  ENDOCRINE 1. Hyperglycemia (resolved); now HYPOglycemia - D5NS gtt;  - Continue SSI PRN; NPO  NEUROLOGIC 1. Seizures; Prior stoke; Alzheimer's demetia - continuous EEG (10/15): showed no seizures - continue Keppra and Vimpat; Ativan PRN - Brain MRI has been ordered since 10/15 but still not performed yet; hopefully can be done today.  - appreciate Neurology assistance   32 minutes spent with this patient.   Milana Obey, MD Pulmonary and Critical Care Medicine [facility] Pager: [phone]  01/18/2017, 4:42 PM

## 2017-01-19 ENCOUNTER — Inpatient Hospital Stay (HOSPITAL_COMMUNITY): Payer: Medicare (Managed Care)

## 2017-01-19 LAB — CBC WITH DIFFERENTIAL/PLATELET
BASOS PCT: 0 %
Basophils Absolute: 0.1 10*3/uL (ref 0.0–0.1)
EOS ABS: 0.3 10*3/uL (ref 0.0–0.7)
Eosinophils Relative: 2 %
HCT: 35.3 % — ABNORMAL LOW (ref 36.0–46.0)
HEMOGLOBIN: 11.5 g/dL — AB (ref 12.0–15.0)
Lymphocytes Relative: 9 %
Lymphs Abs: 1.4 10*3/uL (ref 0.7–4.0)
MCH: 29 pg (ref 26.0–34.0)
MCHC: 32.6 g/dL (ref 30.0–36.0)
MCV: 88.9 fL (ref 78.0–100.0)
Monocytes Absolute: 1.1 10*3/uL — ABNORMAL HIGH (ref 0.1–1.0)
Monocytes Relative: 8 %
NEUTROS PCT: 81 %
Neutro Abs: 12.2 10*3/uL — ABNORMAL HIGH (ref 1.7–7.7)
Platelets: 643 10*3/uL — ABNORMAL HIGH (ref 150–400)
RBC: 3.97 MIL/uL (ref 3.87–5.11)
RDW: 15.2 % (ref 11.5–15.5)
WBC: 15.1 10*3/uL — AB (ref 4.0–10.5)

## 2017-01-19 LAB — HEMOGLOBIN A1C
Hgb A1c MFr Bld: 5.4 % (ref 4.8–5.6)
MEAN PLASMA GLUCOSE: 108.28 mg/dL

## 2017-01-19 LAB — GLUCOSE, CAPILLARY
GLUCOSE-CAPILLARY: 124 mg/dL — AB (ref 65–99)
GLUCOSE-CAPILLARY: 110 mg/dL — AB (ref 65–99)
Glucose-Capillary: 103 mg/dL — ABNORMAL HIGH (ref 65–99)
Glucose-Capillary: 110 mg/dL — ABNORMAL HIGH (ref 65–99)
Glucose-Capillary: 111 mg/dL — ABNORMAL HIGH (ref 65–99)
Glucose-Capillary: 94 mg/dL (ref 65–99)

## 2017-01-19 LAB — LIPID PANEL
CHOL/HDL RATIO: 2.1 ratio
Cholesterol: 117 mg/dL (ref 0–200)
HDL: 57 mg/dL (ref >40)
LDL CALC: 45 mg/dL (ref 0–99)
Triglycerides: 75 mg/dL (ref <150)
VLDL: 15 mg/dL (ref 0–40)

## 2017-01-19 LAB — BASIC METABOLIC PANEL
Anion gap: 7 (ref 5–15)
BUN: 12 mg/dL (ref 6–20)
CALCIUM: 9 mg/dL (ref 8.9–10.3)
CO2: 22 mmol/L (ref 22–32)
Chloride: 109 mmol/L (ref 101–111)
Creatinine, Ser: 0.69 mg/dL (ref 0.44–1.00)
Glucose, Bld: 105 mg/dL — ABNORMAL HIGH (ref 65–99)
Potassium: 3.5 mmol/L (ref 3.5–5.1)
SODIUM: 138 mmol/L (ref 135–145)

## 2017-01-19 LAB — MAGNESIUM: MAGNESIUM: 1.9 mg/dL (ref 1.7–2.4)

## 2017-01-19 LAB — PHOSPHORUS: PHOSPHORUS: 2.2 mg/dL — AB (ref 2.5–4.6)

## 2017-01-19 MED ORDER — ORAL CARE MOUTH RINSE
15.0000 mL | Freq: Two times a day (BID) | OROMUCOSAL | Status: DC
  Administered 2017-01-19 – 2017-01-23 (×10): 15 mL via OROMUCOSAL

## 2017-01-19 MED ORDER — DEXTROSE 5 % IV SOLN
20.0000 mmol | Freq: Once | INTRAVENOUS | Status: AC
  Administered 2017-01-19: 20 mmol via INTRAVENOUS
  Filled 2017-01-19: qty 6.67

## 2017-01-19 MED ORDER — ASPIRIN 325 MG PO TABS
325.0000 mg | ORAL_TABLET | Freq: Every day | ORAL | Status: DC
  Administered 2017-01-19 – 2017-01-20 (×2): 325 mg via ORAL
  Filled 2017-01-19 (×2): qty 1

## 2017-01-19 MED ORDER — CLOPIDOGREL BISULFATE 75 MG PO TABS
75.0000 mg | ORAL_TABLET | Freq: Every day | ORAL | Status: DC
  Administered 2017-01-19 – 2017-01-23 (×5): 75 mg via ORAL
  Filled 2017-01-19 (×5): qty 1

## 2017-01-19 MED ORDER — POTASSIUM PHOSPHATES 15 MMOLE/5ML IV SOLN: 20.0000 mmol | Freq: Once | INTRAVENOUS | Status: DC

## 2017-01-19 NOTE — Progress Notes (Signed)
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 Now admitted with acute SZ. Extubated 10/14 evening. Then 10/14 overnight she had a seizure of approx 35 min duration which finally stopped after 3mg  Ativan. Patient was loaded with Keppra and continued on Vimpat. She remained very somnolent and post-ictal on 10/15. Continuous EEG showed no seizure. Today she is more awake and intermittently obeying commands but is still somnolent.   SUBJECTIVE:  Per RN, on room air Foley placed overnight for urinary retention ~ 1L SLP 10/16- passed for regular; thin liquid diet Given ativan 1mg  at 0605 for MRI which is pending  VITAL SIGNS: BP (!) 106/54   Pulse (!) 109   Temp 99.6 F (37.6 C) (Axillary)   Resp 16   Ht 4\' 11"  (1.499 m)   Wt 100 lb 8.5 oz (45.6 kg)   LMP  (LMP Unknown)   SpO2 96%   BMI 20.30 kg/m   HEMODYNAMICS:    VENTILATOR SETTINGS:    INTAKE / OUTPUT: I/O last 3 completed shifts: In: 2177.5 [I.V.:1750; IV Piggyback:427.5] Out: 1315 [Urine:1315]  PHYSICAL EXAMINATION: General:  Thin elderly female lying in bed, in NAD HEENT: MM pink/dry, no JVD, pupils 4/=/reactive Neuro: initially aroused to pain, but then would open eyes, track, follow commands on right side, flaccid on left, and moaned with pain response otherwise non-verbal and lethargic CV: rrr, no m/r/g PULM: even/non-labored, lungs bilaterally clear WU:JWJXGI:soft, non-tender, bs active  Extremities: warm/dry, no edema  Skin: no rashes   LABS:  BMET  Recent Labs Lab 01/17/17 0517 01/18/17 1044 01/19/17 0309  NA 138 139 138  K 3.6 3.7 3.5   CL 105 112* 109  CO2 20* 22 22  BUN 16 16 12   CREATININE 0.97 0.79 0.69  GLUCOSE 146* 89 105*    Electrolytes  Recent Labs Lab 02/01/2017 2147 01/17/17 0517 01/18/17 1044 01/19/17 0309  CALCIUM  --  9.3 9.1 9.0  MG 1.8  --  1.9 1.9  PHOS 2.9  --  2.5 2.2*    CBC  Recent Labs Lab 01/17/17 0517 01/18/17 1044 01/19/17 0309  WBC 20.1* 9.6 15.1*  HGB 13.2 11.7* 11.5*  HCT 40.9 36.8 35.3*  PLT 789* 668* 643*    Coag's  Recent Labs Lab 01/31/2017 1500  APTT 28  INR 1.04    Sepsis Markers  Recent Labs Lab 01/26/2017 1824 01/15/2017 2147  LATICACIDVEN 3.2* 1.5  PROCALCITON <0.10  --     ABG  Recent Labs Lab 01/17/2017 1636 01/17/17 1249  PHART 7.503* 7.411  PCO2ART 33.7 40.8  PO2ART 496.0* 125.0*    Liver Enzymes  Recent Labs Lab 01/22/2017 1500  AST 37  ALT 28  ALKPHOS 95  BILITOT 0.8  ALBUMIN 3.9    Cardiac Enzymes  Recent Labs Lab 01/29/2017 1824 01/06/2017 2147 01/17/17 0517  TROPONINI <0.03 <0.03 0.03*    Glucose  Recent Labs Lab 01/18/17 1201 01/18/17 1604 01/18/17 1950 01/18/17 2317 01/19/17 0311 01/19/17 0757  GLUCAP 77 96 107* 108* 111* 124*   Imaging No results found. STUDIES:  Ct Head Code Stroke Wo Contrast(01/03/2017):  IMPRESSION: 1. Mix  of acute/subacute and chronic ischemia suspected in the right MCA territory. No associated hemorrhage or mass effect. 2. ASPECTS is 7 3. Underlying chronic small vessel disease.  CTA Head/Neck (10/14):  1. Negative for emergent large vessel occlusion. And no intracranial arterial stenosis or occlusion identified. 2. Positive for patent right cervical carotid stent with in-stent stenosis at the level of the right ICA origin up to 65%. 3. Negative left carotid and bilateral vertebral arteries. 4. Intubated. ET tube terminates above the carina. Enteric tube courses into the esophagus.  CXR (10/15):  1. Interval extubation. 2. Shallow lung inflation with mild bibasilar atelectasis. No  other active cardiopulmonary disease.  MRI head 10/17: >>  SIGNIFICANT EVENTS: Seizure 10/14 PM  LINES/TUBES: 18G and 20G PIV's ETT 10/14 >>10/14 Foley 10/16 >>  ASSESSMENT / PLAN: 71 year old with history of seizure disorder, Alzheimer's, prior CVA with left hemiparesis admitted with seizure activity, intubated due to poor mental status and inability to protect airway  PULMONARY No active issues; extubated 10/14. Somnolent on 10/15 due to post-ictal, ABG showed no hypercapnea. CXR clear 10/15, mild atelectasis - on room air - pulmonary hygiene, OOB, PT following - CXR in am - SLP following; remains mild aspiration risk  CARDIOVASCULAR No active issues   RENAL 1. Hypokalemia (improved): 2. Hypophos 3. Urinary retention s/p foley placement 10/16 - Kphos 20 mmol x 1 - trend Bmet/ mag/ phos - replace electrolytes as needed - Follow urine output, assess for foley removal daily  GASTROINTESTINAL No active issues - SLP following for diet recommendations - d/c protonix  HEMATOLOGIC 1. Leukocytosis - ? Stress response, afebrile Trend CBC Heparin SQ/ SCDs  INFECTIOUS 1. Leukocytosis - ? Stress response, afebrile - CXR shows no infiltrate 10/15 - monitor clinically and culture as necessary - trend WBC and fever curver  ENDOCRINE 1. Hyperglycemia(resolved); now HYPOglycemia - D5NS gtt at 50ml, likely can d/c when PO increase - Continue SSI PRN  NEUROLOGIC 1. Seizures; Prior stoke; Alzheimer's demetia - continuous EEG (10/15): showed no seizures - Keppra and Vimpat per Neuro; Ativan PRN seizure - Brain MRI 10/17 pending >> - appreciate Neurology assistance - PT/OT following    Transfer to tele.  PCCM will sign off and TRH resume primary as of 10/18.   Posey Boyer, AGACNP-BC [facility] Pgr: [phone] or if no answer [phone] 01/19/2017, 8:52 AM

## 2017-01-19 NOTE — Evaluation (Addendum)
Physical Therapy Evaluation Patient Details Name: Sandra Johnston MRN: [id number] DOB: 1945-06-10 Today's Date: 01/19/2017   History of Present Illness  This 71 y.o. female admitted with acute seizure.  SHe was intubated and extubated on 10/14.  MRI of head showed Acute confluent white matter infarct in the upper right MCA  Clinical Impression  Pt with fetal posture and very rigid trunk.  Left side with increased tone and stiffness (from prior stroke).  Pt is significantly debilitated and per RN report her daughter can no longer care for her at home.  SNF at d/c is appropriate.   PT to follow acutely for deficits listed below.       Follow Up Recommendations SNF    Equipment Recommendations  Hospital bed;Other (comment) (hoyer lift)    Recommendations for Other Services   NA    Precautions / Restrictions Precautions Precautions: Fall      Mobility  Bed Mobility Overal bed mobility: Needs Assistance Bed Mobility: Sit to Supine       Sit to supine: Total assist;+2 for physical assistance   General bed mobility comments: Unable to assist with return to bed assist at trunk and bil LEs from therapist.   Transfers Overall transfer level: Needs assistance Equipment used: None Transfers: Sit to/from Stand Sit to Stand: +2 physical assistance;Total assist         General transfer comment: assist needed to support trunk, block bil knees, and stabilize pelvis with bed pad.  Pt able to take weight through legs with knees blocked, however, I do not think she was actively assisting much.   Ambulation/Gait             General Gait Details: Unable, per RN report she was mostly WC bound.       Modified Rankin (Stroke Patients Only) Modified Rankin (Stroke Patients Only) Pre-Morbid Rankin Score: Severe disability Modified Rankin: Severe disability     Balance Overall balance assessment: Needs assistance Sitting-balance support: Feet supported Sitting balance-Leahy  Scale: Poor Sitting balance - Comments: Two person mod assist at EOB.  forward flexed posture and inwardly rotated and rounded in shoulders and spine.    Standing balance support: No upper extremity supported Standing balance-Leahy Scale: Zero                               Pertinent Vitals/Pain Pain Assessment: Faces Faces Pain Scale: Hurts little more Pain Location: Lt UE with attempts to perform PROM if scapula not aligned  and left lower extremity due to increased tone Pain Descriptors / Indicators: Grimacing;Guarding Pain Intervention(s): Limited activity within patient's tolerance;Monitored during session;Repositioned    Home Living Family/patient expects to be discharged to:: Skilled nursing facility                 Additional Comments: Per chart, pt was living with daughter and per RN daughter did not feel    Prior Function           Comments: Per RN, pt was w/c bound, with family assisting with ADLs and transfers, but unsure how much assist they were providing         Extremity/Trunk Assessment   Upper Extremity Assessment Upper Extremity Assessment: Defer to OT evaluation    Lower Extremity Assessment Lower Extremity Assessment: RLE deficits/detail;LLE deficits/detail RLE Deficits / Details: right leg generally weak, <3-/5 per gross functional assessment.  She is stronger on her right leg than left.  LLE  Deficits / Details: left leg is weak and has increased tone compared to the right leg. This is likely from previous PMH of stroke.  She respomded well to bil LE ROM however, she has a good amount of recoil after the stretch and balls back tup to bil hip and knee flexion when left to rest.     Cervical / Trunk Assessment Cervical / Trunk Assessment: Kyphotic;Other exceptions Cervical / Trunk Exceptions: Very rigid and rounded forward  Communication   Communication: Expressive difficulties;Receptive difficulties  Cognition Arousal/Alertness:  Lethargic Behavior During Therapy: Flat affect Overall Cognitive Status: No family/caregiver present to determine baseline cognitive functioning                                 General Comments: Pt able to state her name             Assessment/Plan    PT Assessment Patient needs continued PT services  PT Problem List Decreased strength;Decreased activity tolerance;Decreased balance;Decreased mobility;Decreased range of motion;Decreased coordination;Decreased cognition;Decreased knowledge of use of DME;Decreased safety awareness;Decreased knowledge of precautions;Impaired sensation;Impaired tone;Pain       PT Treatment Interventions DME instruction;Functional mobility training;Therapeutic activities;Therapeutic exercise;Neuromuscular re-education;Balance training;Cognitive remediation;Patient/family education;Manual techniques    PT Goals (Current goals can be found in the Care Plan section)  Acute Rehab PT Goals Patient Stated Goal: unable  PT Goal Formulation: Patient unable to participate in goal setting Time For Goal Achievement: 02/02/17 Potential to Achieve Goals: Fair    Frequency Min 2X/week   Barriers to discharge Decreased caregiver support Daughter can no longer care for her in her current state of health       AM-PAC PT "6 Clicks" Daily Activity  Outcome Measure Difficulty turning over in bed (including adjusting bedclothes, sheets and blankets)?: Unable Difficulty moving from lying on back to sitting on the side of the bed? : Unable Difficulty sitting down on and standing up from a chair with arms (e.g., wheelchair, bedside commode, etc,.)?: Unable Help needed moving to and from a bed to chair (including a wheelchair)?: Total Help needed walking in hospital room?: Total Help needed climbing 3-5 steps with a railing? : Total 6 Click Score: 6    End of Session Equipment Utilized During Treatment: Gait belt Activity Tolerance: Patient limited by  fatigue Patient left: in bed;with call bell/phone within reach   PT Visit Diagnosis: Muscle weakness (generalized) (M62.81);Other symptoms and signs involving the nervous system (Z61.096(R29.898)    Time: [id number] PT Time Calculation (min) (ACUTE ONLY): 10 min   Charges:      Lurena Joinerebecca B. Raed Schalk, PT, DPT [phone]    PT Evaluation $PT Eval Moderate Complexity: 1 Mod     01/19/2017, 11:12 PM

## 2017-01-19 NOTE — Evaluation (Signed)
Occupational Therapy Evaluation Patient Details Name: Sandra Johnston MRN: [id number] DOB: 1945/08/29 Today's Date: 01/19/2017    History of Present Illness This 71 y.o. female admitted with acute seizure.  SHe was intubated and extubated on 10/14.  MRI of head showed Acute confluent white matter infarct in the upper right MCA   Clinical Impression    Pt admitted with above. She demonstrates the below listed deficits and will benefit from continued OT to maximize safety and independence with BADLs.  Pt with Lt hemiplegia, increased spasticity, and rigidity.  She demonstrates limited ROM, decreased balance, impaired cognition.  She requires total A for ADLs and all aspects of mobility.   Per RN and chart, pt was living with daughter.  Anticipate she will require SNF at discharge.     Follow Up Recommendations  SNF    Equipment Recommendations  None recommended by OT    Recommendations for Other Services       Precautions / Restrictions Precautions Precautions: Fall      Mobility Bed Mobility Overal bed mobility: Needs Assistance Bed Mobility: Rolling;Supine to Sit Rolling: Total assist   Supine to sit: Total assist     General bed mobility comments: Pt unable to assist with bed mobility   Transfers                 General transfer comment: unable to safely attempt     Balance Overall balance assessment: Needs assistance Sitting-balance support: Feet supported Sitting balance-Leahy Scale: Poor Sitting balance - Comments: Pt able to maintain EOB sitting with mod A.  soft tissue mobilization performed for thoracic extension, rib cage, lumbar extension followed by facilitation of trunk rotation, ant/post pelvic tils.  Attempted lateral flexion bil.                                    ADL either performed or assessed with clinical judgement   ADL Overall ADL's : Needs assistance/impaired Eating/Feeding: Total assistance;Sitting   Grooming:  Wash/dry hands;Wash/dry face;Oral care;Brushing hair;Total assistance;Sitting   Upper Body Bathing: Total assistance;Sitting;Bed level   Lower Body Bathing: Total assistance;Bed level   Upper Body Dressing : Total assistance;Bed level   Lower Body Dressing: Total assistance;Bed level   Toilet Transfer: Total assistance   Toileting- Clothing Manipulation and Hygiene: Total assistance;Bed level       Functional mobility during ADLs: Total assistance General ADL Comments: Pt unable to assist with ADLs      Vision   Additional Comments: unable to accurately assess      Perception Perception Comments: unable to accurately assess    Praxis Praxis Praxis-Other Comments: unable to accurately assess     Pertinent Vitals/Pain Pain Assessment: Faces Faces Pain Scale: Hurts little more Pain Location: Lt UE with attempts to perform PROM if scapula not aligned  Pain Descriptors / Indicators: Grimacing;Guarding     Hand Dominance  (unsure )   Extremity/Trunk Assessment Upper Extremity Assessment Upper Extremity Assessment: RUE deficits/detail;LUE deficits/detail RUE Deficits / Details: Pt spontaneously moving Rt UE, but does demonstrate tightness and rigidity at times which is worse in the shoulder  LUE Deficits / Details: No active movement of Lt UE noted.  SHe maintains Lt UE in flexed position with internal rotation of the shoulder.   Pt initially grimaces with attempts to extend the elbow, however, once scapula mobilized into adduction and upward rotation , she was able to achieve full PROM  elbow distally and shoulder to 80* without indication of pain/discomfort  LUE Coordination: decreased fine motor;decreased gross motor   Lower Extremity Assessment Lower Extremity Assessment: Defer to PT evaluation   Cervical / Trunk Assessment Cervical / Trunk Assessment: Kyphotic;Other exceptions Cervical / Trunk Exceptions: Pt with rigidity of trunk. She keeps head/neck laterally flexed  to Lt and rotated to the Rt  SHe does not disassociate trunk movements.  Bil. rib cages flared.      Communication Communication Communication: Expressive difficulties;Receptive difficulties   Cognition Arousal/Alertness: Lethargic Behavior During Therapy: Flat affect Overall Cognitive Status: No family/caregiver present to determine baseline cognitive functioning                                 General Comments: Pt was able to state her name when asked (required prompts to do so), and possibly followed one 1 step command.      General Comments  VSS     Exercises     Shoulder Instructions      Home Living Family/patient expects to be discharged to:: Skilled nursing facility                                 Additional Comments: Per chart, pt was living with daughter       Prior Functioning/Environment          Comments: Per RN, pt was w/c bound, with family assisting with ADLs and transfers, but unsure how much assist they were providing         OT Problem List: Decreased strength;Decreased range of motion;Impaired balance (sitting and/or standing);Decreased activity tolerance;Impaired vision/perception;Decreased coordination;Decreased cognition;Decreased safety awareness;Decreased knowledge of use of DME or AE;Impaired tone;Impaired UE functional use;Pain      OT Treatment/Interventions: Self-care/ADL training;Neuromuscular education;DME and/or AE instruction;Therapeutic activities;Patient/family education;Balance training    OT Goals(Current goals can be found in the care plan section) Acute Rehab OT Goals OT Goal Formulation: Patient unable to participate in goal setting Time For Goal Achievement: 02/02/17 Potential to Achieve Goals: Fair ADL Goals Pt Will Perform Eating: with mod assist;sitting;with adaptive utensils Pt Will Transfer to Toilet: with max assist;stand pivot transfer;bedside commode Additional ADL Goal #1: Pt will sit EOB  with min A in prep for ADLs and functional transfers  Additional ADL Goal #2: Family will be independent with PROM of UEs   OT Frequency: Min 2X/week   Barriers to D/C: Decreased caregiver support          Co-evaluation              AM-PAC PT "6 Clicks" Daily Activity     Outcome Measure Help from another person eating meals?: Total Help from another person taking care of personal grooming?: Total Help from another person toileting, which includes using toliet, bedpan, or urinal?: Total Help from another person bathing (including washing, rinsing, drying)?: Total Help from another person to put on and taking off regular upper body clothing?: Total Help from another person to put on and taking off regular lower body clothing?: Total 6 Click Score: 6   End of Session Nurse Communication: Mobility status  Activity Tolerance: Patient limited by lethargy Patient left: in bed;Other (comment) (with PT )  OT Visit Diagnosis: Unsteadiness on feet (R26.81);Muscle weakness (generalized) (M62.81);Cognitive communication deficit (R41.841);Hemiplegia and hemiparesis Symptoms and signs involving cognitive functions: Cerebral infarction Hemiplegia - Right/Left: Left  Hemiplegia - dominant/non-dominant: Non-Dominant Hemiplegia - caused by: Cerebral infarction                Time: 1610-9604 OT Time Calculation (min): 33 min Charges:  OT General Charges $OT Visit: 1 Visit OT Evaluation $OT Eval Moderate Complexity: 1 Mod OT Treatments $Neuromuscular Re-education: 8-22 mins G-Codes:     [employer], OTR/L [phone]   Jeani Hawking M 01/19/2017, 5:46 PM

## 2017-01-19 NOTE — Progress Notes (Signed)
Subjective Patient seen and examined. Essentially unchanged exam. Intermittently follows commands, and at other times does not follow commands. No clinical seizures noted.  Objective Vitals:   01/19/17 1500 01/19/17 1551  BP: 136/74 (!) 150/79  Pulse: (!) 109 (!) 105  Resp: 18 19  Temp:  98.8 F (37.1 C)  SpO2: 97% 100%   NEUROLOGICAL EXAM Patient is awake, alert, oriented to self and place-hospital but not to city. Follows simple commands briskly. Cranial nerves: Pupils equal round reactive to light, extraocular movements are intact, left facial droop, hearing intact, palate elevates symmetrically. Motor exam: Right upper and lower extremity 5/5. Spastic paresis left upper extremity worse than left lower extremity-which is at baseline. Sensation intact to light touch all over. DTRs: 2+ on the right, hyperreflexic on the left.  Imaging Initial CT scan was concerning for acute on chronic ischemia. MRI obtained today shows extensive white matter changes from the prior stroke but also reveals some acute component of ischemic stroke in the right MCA territory. I discussed this case with the neuroradiologist after reviewing the images, and this is Ace acute to subacute looking stroke on top of the extensive damage from the prior right MCA stroke. CT angiogram of the head and neck shows a patent right ICA stent along with no large vessel occlusion.  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 Her exam was consistent with a prolonged postictal state, which I initially assumed was only because of low brain reserve secondary to the old infarct but MRI imaging that has been obtained to evaluate her brain reveals an acute component over and top of the already existing extensive chronic right MCA infarct. I spoke with the  daughter in detail. She said they were not told why she had a stroke and if an etiology was ascertained. The patient does have a right ICA stent. I did not have any reports of cardiac monitoring from the outside hospital.  At this time, I think the patient had a seizure emanating from the right cerebral hemisphere that brought her in. The acute stroke on MRI might be related to episodes of hypotension or might be a new embolic episode. Since we do not have all the details from her workup done from the prior stroke, I would recommend some further workup.  Recommendations  For the new stroke seen on MRI: -At this time, I would recommend obtaining an echocardiogram -Head and neck vascular imaging is already been completed and reported above. -Patient was on aspirin and Plavix at home, which I did not see on her medication list today. I would resume aspirin and Plavix. -Maintain the patient on telemetry -Obtain hemoglobin A1c and fasting lipid panel. (On 10/14 triglycerides were drawn and the level was 110. I do not see a complete lipid panel.)  For the seizure that brought the patient in: -Continue with Vimpat 100 twice a day and Keppra 750 twice a day. -Seizure precautions -Correction of toxic metabolic derangements. Patient has a white count of 15.6 today.  Neurology will follow with you.  Milon DikesAshish Taneasha Fuqua, MD [facility] [phone]  If 7pm to 7am, please call on call as listed on AMION.

## 2017-01-20 ENCOUNTER — Inpatient Hospital Stay (HOSPITAL_COMMUNITY): Payer: Medicare (Managed Care)

## 2017-01-20 ENCOUNTER — Encounter (HOSPITAL_COMMUNITY): Payer: Self-pay

## 2017-01-20 DIAGNOSIS — I63131 Cerebral infarction due to embolism of right carotid artery: Secondary | ICD-10-CM

## 2017-01-20 DIAGNOSIS — R338 Other retention of urine: Secondary | ICD-10-CM

## 2017-01-20 DIAGNOSIS — G40909 Epilepsy, unspecified, not intractable, without status epilepticus: Secondary | ICD-10-CM

## 2017-01-20 DIAGNOSIS — G301 Alzheimer's disease with late onset: Secondary | ICD-10-CM

## 2017-01-20 DIAGNOSIS — R739 Hyperglycemia, unspecified: Secondary | ICD-10-CM

## 2017-01-20 DIAGNOSIS — D72828 Other elevated white blood cell count: Secondary | ICD-10-CM

## 2017-01-20 DIAGNOSIS — G300 Alzheimer's disease with early onset: Secondary | ICD-10-CM

## 2017-01-20 DIAGNOSIS — F0280 Dementia in other diseases classified elsewhere without behavioral disturbance: Secondary | ICD-10-CM

## 2017-01-20 DIAGNOSIS — F0281 Dementia in other diseases classified elsewhere with behavioral disturbance: Secondary | ICD-10-CM

## 2017-01-20 DIAGNOSIS — E876 Hypokalemia: Secondary | ICD-10-CM

## 2017-01-20 DIAGNOSIS — I639 Cerebral infarction, unspecified: Secondary | ICD-10-CM

## 2017-01-20 LAB — MAGNESIUM: MAGNESIUM: 1.5 mg/dL — AB (ref 1.7–2.4)

## 2017-01-20 LAB — BASIC METABOLIC PANEL
Anion gap: 8 (ref 5–15)
BUN: 8 mg/dL (ref 6–20)
CALCIUM: 8.8 mg/dL — AB (ref 8.9–10.3)
CO2: 22 mmol/L (ref 22–32)
CREATININE: 0.67 mg/dL (ref 0.44–1.00)
Chloride: 106 mmol/L (ref 101–111)
Glucose, Bld: 101 mg/dL — ABNORMAL HIGH (ref 65–99)
Potassium: 3.1 mmol/L — ABNORMAL LOW (ref 3.5–5.1)
SODIUM: 136 mmol/L (ref 135–145)

## 2017-01-20 LAB — CBC WITH DIFFERENTIAL/PLATELET
BASOS ABS: 0.1 10*3/uL (ref 0.0–0.1)
BASOS PCT: 0 %
EOS ABS: 0.2 10*3/uL (ref 0.0–0.7)
Eosinophils Relative: 1 %
HEMATOCRIT: 35.1 % — AB (ref 36.0–46.0)
Hemoglobin: 11.3 g/dL — ABNORMAL LOW (ref 12.0–15.0)
Lymphocytes Relative: 9 %
Lymphs Abs: 1.7 10*3/uL (ref 0.7–4.0)
MCH: 28.5 pg (ref 26.0–34.0)
MCHC: 32.2 g/dL (ref 30.0–36.0)
MCV: 88.4 fL (ref 78.0–100.0)
Monocytes Absolute: 1.2 10*3/uL — ABNORMAL HIGH (ref 0.1–1.0)
Monocytes Relative: 7 %
NEUTROS ABS: 15.1 10*3/uL — AB (ref 1.7–7.7)
NEUTROS PCT: 83 %
Platelets: 627 10*3/uL — ABNORMAL HIGH (ref 150–400)
RBC: 3.97 MIL/uL (ref 3.87–5.11)
RDW: 15.5 % (ref 11.5–15.5)
WBC: 18.2 10*3/uL — AB (ref 4.0–10.5)

## 2017-01-20 LAB — GLUCOSE, CAPILLARY
GLUCOSE-CAPILLARY: 93 mg/dL (ref 65–99)
GLUCOSE-CAPILLARY: 104 mg/dL — AB (ref 65–99)
GLUCOSE-CAPILLARY: 91 mg/dL (ref 65–99)
GLUCOSE-CAPILLARY: 94 mg/dL (ref 65–99)
GLUCOSE-CAPILLARY: 99 mg/dL (ref 65–99)
Glucose-Capillary: 98 mg/dL (ref 65–99)

## 2017-01-20 LAB — ECHOCARDIOGRAM COMPLETE
HEIGHTINCHES: 59 in
WEIGHTICAEL: 1626.11 [oz_av]

## 2017-01-20 LAB — PHOSPHORUS: PHOSPHORUS: 3.1 mg/dL (ref 2.5–4.6)

## 2017-01-20 MED ORDER — RESOURCE THICKENUP CLEAR PO POWD
Freq: Once | ORAL | Status: AC
  Administered 2017-01-20: 20:00:00 via ORAL
  Filled 2017-01-20: qty 125

## 2017-01-20 MED ORDER — ONDANSETRON HCL 4 MG/2ML IJ SOLN
4.0000 mg | Freq: Four times a day (QID) | INTRAMUSCULAR | Status: DC
  Administered 2017-01-21 – 2017-01-24 (×14): 4 mg via INTRAVENOUS
  Filled 2017-01-20 (×15): qty 2

## 2017-01-20 MED ORDER — MAGNESIUM SULFATE 2 GM/50ML IV SOLN
2.0000 g | Freq: Once | INTRAVENOUS | Status: AC
Start: 2017-01-20 — End: 2017-01-20
  Administered 2017-01-20: 2 g via INTRAVENOUS
  Filled 2017-01-20: qty 50

## 2017-01-20 MED ORDER — POTASSIUM CHLORIDE 20 MEQ/15ML (10%) PO SOLN
60.0000 meq | Freq: Once | ORAL | Status: AC
  Administered 2017-01-20: 60 meq via ORAL
  Filled 2017-01-20: qty 45

## 2017-01-20 MED ORDER — POTASSIUM CHLORIDE CRYS ER 20 MEQ PO TBCR
60.0000 meq | EXTENDED_RELEASE_TABLET | Freq: Once | ORAL | Status: DC
  Filled 2017-01-20: qty 3

## 2017-01-20 MED ORDER — ASPIRIN EC 81 MG PO TBEC
81.0000 mg | DELAYED_RELEASE_TABLET | Freq: Every day | ORAL | Status: DC
  Administered 2017-01-21 – 2017-01-23 (×3): 81 mg via ORAL
  Filled 2017-01-20 (×3): qty 1

## 2017-01-20 NOTE — Progress Notes (Addendum)
PROGRESS NOTE    Sandra Johnston  ZOX:[id number] DOB: 02/24/1946 DOA: 01/06/2017 PCP: Charlott Rakes, MD   Brief Narrative:  71 year old WF PMHx Epilepsy, Progressive Alzheimer's Dementia.CVA July 2018 in [place] with residual LEFT Hemiparesis.  She moved recently to [place] to be with her daughter. As per the family she requires help with activities of daily living.   On 10/14 she developed altered mental status, sudden confusion with eye deviation and tonic-clonic activity. She was intubated due to poor mental status. CT scan shows changes consistent with old CVA. Neurology consulted and PCCM called for help with admission.    Subjective: 10/18 A/O 1 (does not know when, where, why) does note she is in the hospital, will track you around room. Follow some commands.   Assessment & Plan:   Active Problems:   Seizures (HCC)  Acute CVA: Right MCA territory -CT Head: Shows acute/subacute stroke see results below -MRI shows multiple new acute/subacute infarcts see results below -patient was supposed to be on Plavix and aspirin at home unsure that patient was actually taking medication  as prescribed.  -Echocardgram pending Hemoglobin A1c pending fasting lipid panel pending  Seizure Disorder -24 hour WJX:[id number] FOR SEIZURE SEE RESULTS BELOW -Lacosamide 850 mg BID -Continue seizure precautions  Alzheimer's Dementia -   Acute Urinary retention  -continue Foley until patient's mental status improves  Leukocytosis - Stress response. Patient has been afebrile, negative bands, negative left shift. We'll hold on starting antibiotics at this time.,   Hyperglycemia  -(resolved); now HYPOglycemia ContinueD5-.9% saline at 22ml/hr, l  HYPOKALEMIA -K-Dur 60 mEq  Hypomagnesemia Magnesium IV 2 g    DVT prophylaxis: ubcutaneous heparin Code Status: full Family Communication: none Disposition Plan: TBD   Consultants:  Stroke team PC  CM     Procedures/Significant Events:  10/14 Seizure 10/14 CT Head Wo Contrast (01/27/2017): - acute/subacute and chronic ischemia suspected in the right MCA territory. -No associated hemorrhage or mass effect.   10/14 CTA Head/Neck: -negative large vessel occlusion.-negative intracranial arterial stenosis or occlusion identified. -Positivefor patent right cervical carotid stent with in-stent stenosis at the level of the right ICA origin up to 65%. - Negative left carotid and bilateral vertebral arteries. 10/15 CXR : - Shallow lung inflation with mild bibasilar atelectasis. No other active cardiopulmonary disease. 10/15 Extubated 10/15 24 hour AOZ:HYQMVHQI seizure. Background activity is abnormal due to persistent right hemispheric low amplitude delta slowing and poorly formed right frontal sharp waves.  These findings suggestive of neuronal dysfunction and some degree of cortical irritability in the RIGHT frontal region.  10/17 MRI head;-Large remote right MCA territory infarct centered at the posterior temporal and parietal lobes, but also extending along the insula and sylvian fissure.  -Hazy restricted diffusion in the right corona radiata and centrum semiovale with discrete infarct seen at the occipital parietal subcortical white matter on RIGHT. -chronic small vessel ischemia. - Remote lacunar infarcts asymmetrically in the LEFT>>RIGHT more than right thalamus. -Asymmetric injury in the right basal ganglia.  -Hemosiderin staining along the remote infarct. No acute hemorrhage or hydrocephalus.      I have personally reviewed and interpreted all radiology studies and my findings are as above.  VENTILATOR SETTINGS: None   Cultures none  Antimicrobials: Anti-infectives    None       Devices    LINES / TUBES:  18G and 20G PIV's ETT 10/14 >>10/14 Foley 10/16 >>     Continuous Infusions: . dextrose 5 % and 0.9% NaCl 50 mL/hr at  01/19/17 2130  . lacosamide (VIMPAT) IV  Stopped (01/19/17 2336)  . levETIRAcetam Stopped (01/19/17 2230)     Objective: Vitals:   01/19/17 1551 01/19/17 2058 01/20/17 0044 01/20/17 0439  BP: (!) 150/79 (!) 118/53 127/67 130/77  Pulse: (!) 105 91 89 98  Resp: 19  20 20   Temp: 98.8 F (37.1 C) 99.1 F (37.3 C) 98.5 F (36.9 C) 98.6 F (37 C)  TempSrc: Axillary Axillary Axillary Axillary  SpO2: 100% 100% 100% 100%  Weight:    101 lb 10.1 oz (46.1 kg)  Height:        Intake/Output Summary (Last 24 hours) at 01/20/17 0800 Last data filed at 01/20/17 0440  Gross per 24 hour  Intake            492.5 ml  Output              575 ml  Net            -82.5 ml   Filed Weights   01/18/17 0500 01/19/17 0500 01/20/17 0439  Weight: 97 lb (44 kg) 100 lb 8.5 oz (45.6 kg) 101 lb 10.1 oz (46.1 kg)    Examination:  General: A/O 1 (does not where, when, why) does know she is in the hospital,No acute respiratory distress Eyes: negative scleral hemorrhage, negative anisocoria, negative icterus Cardiovascular: Regular rate and rhythm without murmur gallop or rub normal S1 and S2 Abdomen: negative abdominal pain, nondistended, positive soft, bowel sounds, no rebound, no ascites, no appreciable mass Extremities: No significant cyanosis, clubbing, or edema bilateral lower extremities Skin: Negative rashes, lesions, ulcers Psychiatric:  nable to fully evaluate secondary to stroke Central nervous system:  Cranial nerves II through XII intact, tongue/uvula midline, RUE/RLE muscle strength 4/5. sensation intact throughout, nable to perform finger nose finger bilateral, quick finger touch bilateral  positive dysarthria, positive expressive aphasia, negative receptive aphasia.  .     Data Reviewed: Care during the described time interval was provided by me .  I have reviewed this patient's available data, including medical history, events of note, physical examination, and all test results as part of my evaluation.   CBC:  Recent  Labs Lab 2017/02/14 1500 02-14-2017 1515 01/17/17 0517 01/18/17 1044 01/19/17 0309 01/20/17 0518  WBC 17.1*  --  20.1* 9.6 15.1* 18.2*  NEUTROABS 9.8*  --   --  6.6 12.2* 15.1*  HGB 14.2 16.0* 13.2 11.7* 11.5* 11.3*  HCT 43.9 47.0* 40.9 36.8 35.3* 35.1*  MCV 90.7  --  88.9 90.0 88.9 88.4  PLT 870*  --  789* 668* 643* 627*   Basic Metabolic Panel:  Recent Labs Lab 02/14/2017 1500 2017/02/14 1515 02-14-2017 2147 01/17/17 0517 01/18/17 1044 01/19/17 0309 01/20/17 0518  NA 137 139  --  138 139 138 136  K 3.9 3.8  --  3.6 3.7 3.5 3.1*  CL 106 106  --  105 112* 109 106  CO2 18*  --   --  20* 22 22 22   GLUCOSE 158* 162*  --  146* 89 105* 101*  BUN 17 19  --  16 16 12 8   CREATININE 0.91 0.70  --  0.97 0.79 0.69 0.67  CALCIUM 9.4  --   --  9.3 9.1 9.0 8.8*  MG  --   --  1.8  --  1.9 1.9 1.5*  PHOS  --   --  2.9  --  2.5 2.2* 3.1   GFR: Estimated Creatinine Clearance: 44 mL/min (by  C-G formula based on SCr of 0.67 mg/dL). Liver Function Tests:  Recent Labs Lab Feb 06, 2017 1500  AST 37  ALT 28  ALKPHOS 95  BILITOT 0.8  PROT 7.7  ALBUMIN 3.9   No results for input(s): LIPASE, AMYLASE in the last 168 hours. No results for input(s): AMMONIA in the last 168 hours. Coagulation Profile:  Recent Labs Lab 2017-02-06 1500  INR 1.04   Cardiac Enzymes:  Recent Labs Lab 06-Feb-2017 1824 02-06-2017 2147 01/17/17 0517  TROPONINI <0.03 <0.03 0.03*   BNP (last 3 results) No results for input(s): PROBNP in the last 8760 hours. HbA1C:  Recent Labs  01/19/17 1628  HGBA1C 5.4   CBG:  Recent Labs Lab 01/19/17 1152 01/19/17 1620 01/19/17 2235 01/19/17 2341 01/20/17 0343  GLUCAP 103* 110* 110* 94 93   Lipid Profile:  Recent Labs  01/19/17 1628  CHOL 117  HDL 57  LDLCALC 45  TRIG 75  CHOLHDL 2.1   Thyroid Function Tests: No results for input(s): TSH, T4TOTAL, FREET4, T3FREE, THYROIDAB in the last 72 hours. Anemia Panel: No results for input(s): VITAMINB12, FOLATE,  FERRITIN, TIBC, IRON, RETICCTPCT in the last 72 hours. Urine analysis:    Component Value Date/Time   COLORURINE AMBER (A) 01/18/2017 0400   APPEARANCEUR CLOUDY (A) 01/18/2017 0400   LABSPEC 1.021 01/18/2017 0400   PHURINE 5.0 01/18/2017 0400   GLUCOSEU NEGATIVE 01/18/2017 0400   HGBUR LARGE (A) 01/18/2017 0400   BILIRUBINUR NEGATIVE 01/18/2017 0400   KETONESUR 5 (A) 01/18/2017 0400   PROTEINUR NEGATIVE 01/18/2017 0400   NITRITE NEGATIVE 01/18/2017 0400   LEUKOCYTESUR NEGATIVE 01/18/2017 0400   Sepsis Labs: @LABRCNTIP (procalcitonin:4,lacticidven:4)  ) Recent Results (from the past 240 hour(s))  MRSA PCR Screening     Status: None   Collection Time: February 06, 2017  6:48 PM  Result Value Ref Range Status   MRSA by PCR NEGATIVE NEGATIVE Final    Comment:        The GeneXpert MRSA Assay (FDA approved for NASAL specimens only), is one component of a comprehensive MRSA colonization surveillance program. It is not intended to diagnose MRSA infection nor to guide or monitor treatment for MRSA infections.          Radiology Studies: Mr Brain Wo Contrast  Result Date: 01/19/2017 CLINICAL DATA:  Altered level of consciousness. Sudden confusion and reduced responsiveness. EXAM: MRI HEAD WITHOUT CONTRAST TECHNIQUE: Multiplanar, multiecho pulse sequences of the brain and surrounding structures were obtained without intravenous contrast. COMPARISON:  CT and CTA from 3 days prior FINDINGS: Brain: Large remote right MCA territory infarct centered at the posterior temporal and parietal lobes, but also extending along the insula and sylvian fissure. There is hazy restricted diffusion in the right corona radiata and centrum semiovale with discrete infarct seen at the occipital parietal subcortical white matter on the right. Patient is status post recent CTA. There is advanced confluent FLAIR hyperintensity in the cerebral white matter and patchy FLAIR hyperintensity in the pons and deep gray  nuclei, consistent with chronic small vessel ischemia. Remote lacunar infarcts asymmetrically in the left more than right thalamus. Degree of asymmetric injury in the right basal ganglia. Overall normal brain volume. Hemosiderin staining along the remote infarct. No acute hemorrhage or hydrocephalus. Vascular: Recent CTA.  Preserved major flow voids. Skull and upper cervical spine: Negative for marrow lesion Sinuses/Orbits: Negative IMPRESSION: 1. Acute confluent white matter infarct in the upper right MCA territory that is superimposed on large remote MCA infarct. 2. Advanced chronic small vessel  ischemia with confluent white matter gliosis and prior lacunar infarcts. Electronically Signed   By: Marnee SpringJonathon  Watts M.D.   On: 01/19/2017 11:06   Dg Chest Port 1 View  Result Date: 01/20/2017 CLINICAL DATA:  Atelectasis EXAM: PORTABLE CHEST 1 VIEW COMPARISON:  01/17/2017 FINDINGS: Heart size and vascularity normal. Mild left lower lobe atelectasis unchanged. Negative for heart failure or effusion. IMPRESSION: Mild left lower lobe atelectasis unchanged. Electronically Signed   By: Marlan Palauharles  Clark M.D.   On: 01/20/2017 07:35        Scheduled Meds: . aspirin  325 mg Oral Daily  . clopidogrel  75 mg Oral Daily  . heparin  5,000 Units Subcutaneous Q8H  . insulin aspart  0-9 Units Subcutaneous Q4H  . mouth rinse  15 mL Mouth Rinse BID   Continuous Infusions: . dextrose 5 % and 0.9% NaCl 50 mL/hr at 01/19/17 2130  . lacosamide (VIMPAT) IV Stopped (01/19/17 2336)  . levETIRAcetam Stopped (01/19/17 2230)     LOS: 4 days    Time spent: 40 minutes    Dawna Jakes, Roselind MessierURTIS J, MD [facility] Pager [phone]   If 7PM-7AM, please contact night-coverage [email or web address] Password [email or web address] 01/20/2017, 8:01 AM

## 2017-01-20 NOTE — Evaluation (Signed)
Clinical/Bedside Swallow Evaluation Patient Details  Name: Sandra Johnston MRN: [id number] Date of Birth: October 31, 1945  Today's Date: 01/20/2017 Time: SLP Start Time (ACUTE ONLY): 1137 SLP Stop Time (ACUTE ONLY): 1150 SLP Time Calculation (min) (ACUTE ONLY): 13 min  Past Medical History:  Past Medical History:  Diagnosis Date  . Stroke [facility](HCC)    Past Surgical History: History reviewed. No pertinent surgical history. HPI:  71 y.o. female with hx of CVA, Alzheimer's, admitted with focal onset seizure with secondary generalization - most likely from the underlying prior right frontal ischemic stroke. Intubated for airway protection but extubated same day.  MRI of head showed acute confluent white matter infarct in the upper right MCA   Pt underwent swallow assessment 10/17, revealing functional swallow, but RN reports increasing difficulty, coughing with POs, pills.  Swallow eval re-ordered.    Assessment / Plan / Recommendation Clinical Impression  Pt presents with decline in swallowing function since initial clinical swallow assessment on 10/16, at which time she was alert and participatory.  Today, pt required ongoing stimulation to remain awake for reassessment.  She demonstrated oral holding of POs intermittently, coughing after consumption of water.  Regular solids were not provided.  Given change in MS (acute CVA now identified), recommend downgrading diet to dysphagia 1, nectar-thick liquids; meds crushed in puree.  SLP will follow for diet progression/safety and will determine benefit of pursuing instrumental swallow study.  D/W RN.  SLP Visit Diagnosis: Dysphagia, unspecified (R13.10)    Aspiration Risk       Diet Recommendation   dys 1, nectars  Medication Administration: Crushed with puree    Other  Recommendations Oral Care Recommendations: Oral care BID   Follow up Recommendations  (tba)      Frequency and Duration min 2x/week  1 week       Prognosis Prognosis for Safe  Diet Advancement: Fair      Swallow Study   General Date of Onset: 09-30-2016 HPI: 71 y.o. female with hx of CVA, Alzheimer's, admitted with focal onset seizure with secondary generalization - most likely from the underlying prior right frontal ischemic stroke. Intubated for airway protection but extubated same day.  MRI of head showed acute confluent white matter infarct in the upper right MCA   Pt underwent swallow assessment 10/17, revealing functional swallow, but RN reports increasing difficulty, coughing with POs, pills.  Swallow eval re-ordered.  Type of Study: Bedside Swallow Evaluation Previous Swallow Assessment: 01/19/17 Diet Prior to this Study: Regular;Thin liquids Temperature Spikes Noted: No Respiratory Status: Room air History of Recent Intubation: Yes Length of Intubations (days): 1 days Date extubated: 09-30-2016 Behavior/Cognition: Lethargic/Drowsy Oral Cavity Assessment: Within Functional Limits Oral Care Completed by SLP: No Vision: Functional for self-feeding Self-Feeding Abilities: Needs assist Patient Positioning: Upright in bed Baseline Vocal Quality: Low vocal intensity Volitional Cough: Weak Volitional Swallow: Able to elicit    Oral/Motor/Sensory Function Overall Oral Motor/Sensory Function: Within functional limits   Ice Chips Ice chips: Not tested   Thin Liquid Thin Liquid: Impaired Presentation: Cup Oral Phase Functional Implications: Oral holding Pharyngeal  Phase Impairments: Throat Clearing - Delayed    Nectar Thick Nectar Thick Liquid: Impaired Presentation: Cup Oral phase functional implications: Oral holding   Honey Thick Honey Thick Liquid: Not tested   Puree Puree: Impaired Presentation: Spoon Oral Phase Impairments: Poor awareness of bolus Oral Phase Functional Implications: Oral holding   Solid   GO   Solid: Not tested        Sandra Johnston, Sandra Johnston  Sandra Johnston 01/20/2017,12:00 PM   Sandra Johnston L. Sandra Johnston, Sandra Johnston

## 2017-01-20 NOTE — Progress Notes (Signed)
  Echocardiogram 2D Echocardiogram has been performed.  Roosvelt MaserLane, Carlon Chaloux F 01/20/2017, 2:47 PM

## 2017-01-20 NOTE — Progress Notes (Signed)
Dr. Joseph ArtWoods notified RN re-consulting ST due to coughing noted after liquids and pt pocketing food. ST notified. Will continue to monitor.

## 2017-01-20 NOTE — Progress Notes (Signed)
Patient c/o pain in R wrist. She had a mitt on. Removed mitt. IV actually in R  Wrist, not forearm. Appears infiltrated. IV stopped. Placed order for  IV consult. Patient is in pain. Will administer analgesic and continue to monitor.

## 2017-01-20 NOTE — Care Management Important Message (Signed)
Important Message  Patient Details  Name: Sandra CorporalMigdalia Sayler MRN: [id number] Date of Birth: [birth year]   Medicare Important Message Given:  Yes    Kyla BalzarineShealy, Dabria Wadas Abena 01/20/2017, 12:15 PM

## 2017-01-20 NOTE — Progress Notes (Addendum)
Neurology Progress Note   S:// No acute changes. Essentially unchanged with intermittent ability to follow commands. No evidence of clinical seizure activity reported overnight.  O:// Current vital signs: BP 130/77 (BP Location: Left Arm)   Pulse 98   Temp 98.6 F (37 C) (Axillary)   Resp 20   Ht 4\' 11"  (1.499 m)   Wt 46.1 kg (101 lb 10.1 oz)   LMP  (LMP Unknown)   SpO2 100%   BMI 20.53 kg/m  Vital signs in last 24 hours: Temp:  [98.2 F (36.8 C)-99.1 F (37.3 C)] 98.6 F (37 C) (10/18 0439) Pulse Rate:  [89-109] 98 (10/18 0439) Resp:  [16-25] 20 (10/18 0439) BP: (118-157)/(53-81) 130/77 (10/18 0439) SpO2:  [97 %-100 %] 100 % (10/18 0439) Weight:  [46.1 kg (101 lb 10.1 oz)] 46.1 kg (101 lb 10.1 oz) (10/18 0439) NEUROLOGICAL EXAM Patient is awake, alert, oriented to self and place-hospital but not to city. Follows simple commands briskly. Cranial nerves: Pupils equal round reactive to light, extraocular movements are intact, left facial droop, hearing intact, palate elevates symmetrically. Motor exam: Right upper and lower extremity 5/5. Spastic paresis left upper extremity worse than left lower extremity-which is at baseline. Sensation intact to light touch all over. DTRs: 2+ on the right, hyperreflexic on the left.  Medications  Current Facility-Administered Medications:  .  acetaminophen (TYLENOL) tablet 650 mg, 650 mg, Oral, Q6H PRN, Karl Ito, MD, 650 mg at 01/20/17 [phone] .  aspirin tablet 325 mg, 325 mg, Oral, Daily, Milon Dikes, MD, 325 mg at 01/19/17 1738 .  clopidogrel (PLAVIX) tablet 75 mg, 75 mg, Oral, Daily, Milon Dikes, MD, 75 mg at 01/19/17 1738 .  dextrose 5 %-0.9 % sodium chloride infusion, , Intravenous, Continuous, Karl Ito, MD, Last Rate: 50 mL/hr at 01/19/17 2130 .  heparin injection 5,000 Units, 5,000 Units, Subcutaneous, Q8H, Mannam, Praveen, MD, 5,000 Units at 01/20/17 0548 .  hydrALAZINE (APRESOLINE) injection 10 mg, 10 mg,  Intravenous, Q4H PRN, Mannam, Praveen, MD, 10 mg at 01/19/17 0511 .  insulin aspart (novoLOG) injection 0-9 Units, 0-9 Units, Subcutaneous, Q4H, Mannam, Praveen, MD, 1 Units at 01/19/17 0900 .  lacosamide (VIMPAT) 100 mg in sodium chloride 0.9 % 25 mL IVPB, 100 mg, Intravenous, Q12H, Levester Fresh, MD, Stopped at 01/19/17 2336 .  levETIRAcetam (KEPPRA) 750 mg in sodium chloride 0.9 % 100 mL IVPB, 750 mg, Intravenous, Q12H, Caryl Pina, MD, Last Rate: 430 mL/hr at 01/20/17 0933, 750 mg at 01/20/17 0933 .  magnesium sulfate IVPB 2 g 50 mL, 2 g, Intravenous, Once, Drema Dallas, MD, Last Rate: 50 mL/hr at 01/20/17 0936, 2 g at 01/20/17 0936 .  MEDLINE mouth rinse, 15 mL, Mouth Rinse, BID, Mannam, Praveen, MD, 15 mL at 01/19/17 2228 Labs CBC    Component Value Date/Time   WBC 18.2 (H) 01/20/2017 0518   RBC 3.97 01/20/2017 0518   HGB 11.3 (L) 01/20/2017 0518   HCT 35.1 (L) 01/20/2017 0518   PLT 627 (H) 01/20/2017 0518   MCV 88.4 01/20/2017 0518   MCH 28.5 01/20/2017 0518   MCHC 32.2 01/20/2017 0518   RDW 15.5 01/20/2017 0518   LYMPHSABS 1.7 01/20/2017 0518   MONOABS 1.2 (H) 01/20/2017 0518   EOSABS 0.2 01/20/2017 0518   BASOSABS 0.1 01/20/2017 0518    CMP     Component Value Date/Time   NA 136 01/20/2017 0518   K 3.1 (L) 01/20/2017 0518   CL 106 01/20/2017 0518   CO2  22 01/20/2017 0518   GLUCOSE 101 (H) 01/20/2017 0518   BUN 8 01/20/2017 0518   CREATININE 0.67 01/20/2017 0518   CALCIUM 8.8 (L) 01/20/2017 0518   PROT 7.7 01/15/2017 1500   ALBUMIN 3.9 01/15/2017 1500   AST 37 01/07/2017 1500   ALT 28 01/17/2017 1500   ALKPHOS 95 01/23/2017 1500   BILITOT 0.8 01/30/2017 1500   GFRNONAA >60 01/20/2017 0518   GFRAA >60 01/20/2017 0518    glycosylated hemoglobin - 5.4  Lipid Panel     Component Value Date/Time   CHOL 117 01/19/2017 1628   TRIG 75 01/19/2017 1628   HDL 57 01/19/2017 1628   CHOLHDL 2.1 01/19/2017 1628   VLDL 15 01/19/2017 1628   LDLCALC 45 01/19/2017  1628     Imaging I have reviewed images in epic and the results pertinent to this consultation are: Initial CT scan was concerning for acute on chronic ischemia. MRI obtained today shows extensive white matter changes from the prior stroke but also reveals some acute component of ischemic stroke in the right MCA territory. I discussed this case with the neuroradiologist after reviewing the images, and this is  acute to subacute looking stroke on top of the extensive damage from the prior right MCA stroke. CT angiogram of the head and neck shows a patent right ICA stent along with no large vessel occlusion.  Assessment:  71 year old woman with a right MCA stroke earlier this year with residual left hemiparesis, status post right ICA stent, presented with a focal seizure with secondary generalization, possibly from a right MCA stroke. Imaging reveals some acute areas of infarction over the chronic infarct in the right MCA territory. Unclear etiology of the new areas of right MCA ischemia-hypotension versus cardiac embolic. Workup from outside hospital in [place] not available to review. Current workup shows LDL 45, hemoglobin A1c 5.4. CT angiogram head and neck as mentioned above. Her current clinical condition might be related more to the multifactorial toxic metabolic encephalopathy and not just acute on chronic right MCA stroke. Her current white count is up to 18 from 15 yesterday.  Impression New onset seizure History of right MCA stroke with a acute on chronic right MCA stroke Toxic metabolic encephalopathy Evaluate for underlying infection   Recommendations  Acute on chronic RMCA stroke seen on MRI -Echocardiogram pending -c/w DAPT -Patient might benefit from outpatient loop recorder if the echocardiogram is negative for an embolic source -Follow-up with outpatient neurology  New onset seizure -Continue with Vimpat 100 twice a day and Keppra 750 twice a day. -Maintain  seizure precautions  Toxic metabolic encephalopathy --Correction of toxic metabolic derangements per primary team as you are - her current white count is 18.2, up from 15.6 yesterday. Consider infectious workup.  I will update the note once Echo results are available.  --- Milon DikesAshish Apollonia Amini, MD [facility] [phone] If 7pm to 7am, please call on call as listed on AMION.

## 2017-01-21 ENCOUNTER — Encounter (HOSPITAL_COMMUNITY): Payer: Self-pay | Admitting: *Deleted

## 2017-01-21 DIAGNOSIS — R1319 Other dysphagia: Secondary | ICD-10-CM

## 2017-01-21 DIAGNOSIS — D72829 Elevated white blood cell count, unspecified: Secondary | ICD-10-CM

## 2017-01-21 DIAGNOSIS — E162 Hypoglycemia, unspecified: Secondary | ICD-10-CM

## 2017-01-21 LAB — BASIC METABOLIC PANEL
Anion gap: 10 (ref 5–15)
CHLORIDE: 105 mmol/L (ref 101–111)
CO2: 22 mmol/L (ref 22–32)
CREATININE: 0.62 mg/dL (ref 0.44–1.00)
Calcium: 8.8 mg/dL — ABNORMAL LOW (ref 8.9–10.3)
GFR calc Af Amer: >60 mL/min (ref >60)
GFR calc non Af Amer: >60 mL/min (ref >60)
GLUCOSE: 95 mg/dL (ref 65–99)
Potassium: 3.7 mmol/L (ref 3.5–5.1)
SODIUM: 137 mmol/L (ref 135–145)

## 2017-01-21 LAB — GLUCOSE, CAPILLARY
GLUCOSE-CAPILLARY: 102 mg/dL — AB (ref 65–99)
GLUCOSE-CAPILLARY: 112 mg/dL — AB (ref 65–99)
GLUCOSE-CAPILLARY: 117 mg/dL — AB (ref 65–99)
Glucose-Capillary: 95 mg/dL (ref 65–99)
Glucose-Capillary: 103 mg/dL — ABNORMAL HIGH (ref 65–99)
Glucose-Capillary: 97 mg/dL (ref 65–99)

## 2017-01-21 LAB — CBC WITH DIFFERENTIAL/PLATELET
Basophils Absolute: 0 10*3/uL (ref 0.0–0.1)
Basophils Relative: 0 %
EOS ABS: 0.4 10*3/uL (ref 0.0–0.7)
EOS PCT: 2 %
HCT: 34.6 % — ABNORMAL LOW (ref 36.0–46.0)
Hemoglobin: 11.3 g/dL — ABNORMAL LOW (ref 12.0–15.0)
LYMPHS ABS: 1.4 10*3/uL (ref 0.7–4.0)
Lymphocytes Relative: 10 %
MCH: 29 pg (ref 26.0–34.0)
MCHC: 32.7 g/dL (ref 30.0–36.0)
MCV: 88.7 fL (ref 78.0–100.0)
MONO ABS: 0.7 10*3/uL (ref 0.1–1.0)
MONOS PCT: 5 %
Neutro Abs: 12.5 10*3/uL — ABNORMAL HIGH (ref 1.7–7.7)
Neutrophils Relative %: 83 %
PLATELETS: 651 10*3/uL — AB (ref 150–400)
RBC: 3.9 MIL/uL (ref 3.87–5.11)
RDW: 15.2 % (ref 11.5–15.5)
WBC: 15.1 10*3/uL — AB (ref 4.0–10.5)

## 2017-01-21 LAB — PHOSPHORUS: Phosphorus: 2.7 mg/dL (ref 2.5–4.6)

## 2017-01-21 LAB — MAGNESIUM: MAGNESIUM: 1.9 mg/dL (ref 1.7–2.4)

## 2017-01-21 MED ORDER — PNEUMOCOCCAL VAC POLYVALENT 25 MCG/0.5ML IJ INJ
0.5000 mL | INJECTION | INTRAMUSCULAR | Status: AC
Start: 2017-01-21 — End: 2017-01-21
  Administered 2017-01-21: 0.5 mL via INTRAMUSCULAR
  Filled 2017-01-21: qty 0.5

## 2017-01-21 NOTE — Progress Notes (Signed)
    [facility] has been requested to perform a transesophageal echocardiogram on 10/22 for stroke.  After careful review of history and examination, the risks and benefits of transesophageal echocardiogram have been explained including risks of esophageal damage, perforation (1:10,000 risk), bleeding, pharyngeal hematoma as well as other potential complications associated with conscious sedation including aspiration, arrhythmia, respiratory failure and death. Alternatives to treatment were discussed, questions were answered. Patient is willing to proceed.   Leanna BattlesBarrett, Hondo Nanda, PA-C 01/21/2017 4:15 PM

## 2017-01-21 NOTE — Progress Notes (Signed)
PROGRESS NOTE    Sandra Johnston  RUE:[id number] DOB: 03-21-1946 DOA: 2017-02-14 PCP: Charlott Rakes, MD   Brief Narrative:  71 year old WF PMHx Epilepsy, Progressive Alzheimer's Dementia.CVA July 2018 in [place] with residual LEFT Hemiparesis.  She moved recently to [place] to be with her daughter. As per the family she requires help with activities of daily living.   On 10/14 she developed altered mental status, sudden confusion with eye deviation and tonic-clonic activity. She was intubated due to poor mental status. CT scan shows changes consistent with old CVA. Neurology consulted and PCCM called for help with admission.    Subjective: 10/19 A/O 1 (does not know where, when, why), follows commands. Having hallucinations (believes the ceiling is going to fall on her)     Assessment & Plan:   Active Problems:   Seizures (HCC)  Acute CVA: Right MCA territory -CT Head: Shows acute/subacute stroke see results below -MRI shows multiple new acute/subacute infarcts see results below -patient was supposed to be on Plavix and aspirin at home unsure that patient was actually taking medication  as prescribed.  -10/18 Echocardgram: PFO could not be excluded.  -10/19 spoke with Trish who will schedule patient for Monday TEE -10/17 Hemoglobin A1c= 5.4 does not meet criteria for prediabetes/diabetes  -Lipid panel within AHA guideline   Seizure Disorder -24 hour JYN:WGNFAOZH FOR SEIZURE SEE RESULTS BELOW -Lacosamide 100 mg BID -Keppra 750 mg BID -Continue seizure precautions  Alzheimer's Dementia -Stable? Unable to evaluate secondary to patient's CVA, therefore would not start any medication acutely   Acute Urinary retention  -Continue Foley until patient's mental status improves  Leukocytosis - Afebrile last 24 hours, Stress response?. Negative bands, negative left shift. Will hold on starting antibiotics at this time -For completeness panculture., -10/18 PCXR negative  infectious process see results below  Dysphagia -Approved for dysphagia 1 diet fluid consistency nectar thick. -Most likely cannot satisfy nutritional requirements, begin calorie count. If cannot satisfy nutritional requirements place CorTrak 2   Hyperglycemia  -(resolved); now HYPOglycemia continue D5-0.9% saline at 42ml/hr,   HYPOKALEMIA -resolved   Hypomagnesemia -Resolved     DVT prophylaxis: subcutaneous heparin Code Status: full Family Communication: none Disposition Plan: TBD   Consultants:  Stroke team PC CM     Procedures/Significant Events:  10/14 Seizure 10/14 CT Head Wo Contrast (February 14, 2017): - acute/subacute and chronic ischemia suspected in the right MCA territory. -No associated hemorrhage or mass effect.   10/14 CTA Head/Neck: -negative large vessel occlusion.-negative intracranial arterial stenosis or occlusion identified. -Positivefor patent right cervical carotid stent with in-stent stenosis at the level of the right ICA origin up to 65%. - Negative left carotid and bilateral vertebral arteries. 10/15 CXR : - Shallow lung inflation with mild bibasilar atelectasis. No other active cardiopulmonary disease. 10/15 Extubated 10/15 24 hour YQM:VHQIONGE seizure. Background activity is abnormal due to persistent right hemispheric low amplitude delta slowing and poorly formed right frontal sharp waves.  These findings suggestive of neuronal dysfunction and some degree of cortical irritability in the RIGHT frontal region.  10/17 MRI head;-Large remote right MCA territory infarct centered at the posterior temporal and parietal lobes, but also extending along the insula and sylvian fissure.  -Hazy restricted diffusion in the right corona radiata and centrum semiovale with discrete infarct seen at the occipital parietal subcortical white matter on RIGHT. -chronic small vessel ischemia. - Remote lacunar infarcts asymmetrically in the LEFT>>RIGHT more than right thalamus.  -Asymmetric injury in the right basal ganglia.  -Hemosiderin staining along  the remote infarct. No acute hemorrhage or hydrocephalus. 10/18 PCXR: Negative infectious process 10/18 Echocardiogram: LVEF;:60% to 65%. - Atrial septum: A patent foramen ovale cannot be excluded.      I have personally reviewed and interpreted all radiology studies and my findings are as above.  VENTILATOR SETTINGS: None   Cultures 10/19 urine culture 10/19 blood culture 10/19 sputum culture    Antimicrobials: Anti-infectives    None       Devices    LINES / TUBES:  18G and 20G PIV's ETT 10/14 >>10/14 Foley 10/16 >>     Continuous Infusions: . dextrose 5 % and 0.9% NaCl 50 mL/hr at 01/20/17 2032  . lacosamide (VIMPAT) IV Stopped (01/21/17 0100)  . levETIRAcetam Stopped (01/21/17 0000)     Objective: Vitals:   01/20/17 1700 01/20/17 2054 01/21/17 0049 01/21/17 0456  BP: (!) 147/66 134/72 (!) 155/90 (!) 154/80  Pulse: 89 83 84 86  Resp: 16 18 18 18   Temp: 98.1 F (36.7 C) 98.4 F (36.9 C) 98.5 F (36.9 C) 98.7 F (37.1 C)  TempSrc: Oral Oral Oral Oral  SpO2: 98% 97% 98% 98%  Weight:    118 lb 4.8 oz (53.7 kg)  Height:        Intake/Output Summary (Last 24 hours) at 01/21/17 0810 Last data filed at 01/21/17 0457  Gross per 24 hour  Intake             1705 ml  Output              820 ml  Net              885 ml   Filed Weights   01/20/17 0439 01/20/17 0723 01/21/17 0456  Weight: 101 lb 10.1 oz (46.1 kg) 114 lb 13.8 oz (52.1 kg) 118 lb 4.8 oz (53.7 kg)    Physical Exam:  General: A/O 1 (does not where, when, why) does know she is in the hospital,No acute respiratory distress Eyes: negative scleral hemorrhage, negative anisocoria, negative icterus Cardiovascular: Regular rate and rhythm without murmur gallop or rub normal S1 and S2 Abdomen: negative abdominal pain, nondistended, positive soft, bowel sounds, no rebound, no ascites, no appreciable mass Extremities:  No significant cyanosis, clubbing, or edema bilateral lower extremities Skin: Negative rashes, lesions, ulcers Psychiatric:  unable to fully evaluate secondary to stroke. Having hallucinations believes ceiling is going to fall on her. Central nervous system:  Cranial nerves II through XII intact, tongue/uvula midline, RUE/RLE/LLE muscle strength 4/5.LUE strength 0/5, sensation intact throughout, unable to perform finger nose finger bilateral, quick finger touch bilateral  positive dysarthria, positive expressive aphasia, negative receptive aphasia.  .     Data Reviewed: Care during the described time interval was provided by me .  I have reviewed this patient's available data, including medical history, events of note, physical examination, and all test results as part of my evaluation.   CBC:  Recent Labs Lab February 01, 2017 1500 01-Feb-2017 1515 01/17/17 0517 01/18/17 1044 01/19/17 0309 01/20/17 0518  WBC 17.1*  --  20.1* 9.6 15.1* 18.2*  NEUTROABS 9.8*  --   --  6.6 12.2* 15.1*  HGB 14.2 16.0* 13.2 11.7* 11.5* 11.3*  HCT 43.9 47.0* 40.9 36.8 35.3* 35.1*  MCV 90.7  --  88.9 90.0 88.9 88.4  PLT 870*  --  789* 668* 643* 627*   Basic Metabolic Panel:  Recent Labs Lab Feb 01, 2017 2147 01/17/17 0517 01/18/17 1044 01/19/17 0309 01/20/17 0518 01/21/17 0520  NA  --  138 139 138 136 137  K  --  3.6 3.7 3.5 3.1* 3.7  CL  --  105 112* 109 106 105  CO2  --  20* 22 22 22 22   GLUCOSE  --  146* 89 105* 101* 95  BUN  --  16 16 12 8  <5*  CREATININE  --  0.97 0.79 0.69 0.67 0.62  CALCIUM  --  9.3 9.1 9.0 8.8* 8.8*  MG 1.8  --  1.9 1.9 1.5* 1.9  PHOS 2.9  --  2.5 2.2* 3.1 2.7   GFR: Estimated Creatinine Clearance: 48.3 mL/min (by C-G formula based on SCr of 0.62 mg/dL). Liver Function Tests:  Recent Labs Lab Jan 28, 2017 1500  AST 37  ALT 28  ALKPHOS 95  BILITOT 0.8  PROT 7.7  ALBUMIN 3.9   No results for input(s): LIPASE, AMYLASE in the last 168 hours. No results for input(s): AMMONIA  in the last 168 hours. Coagulation Profile:  Recent Labs Lab 01/28/2017 1500  INR 1.04   Cardiac Enzymes:  Recent Labs Lab 2017-01-28 1824 01/28/2017 2147 01/17/17 0517  TROPONINI <0.03 <0.03 0.03*   BNP (last 3 results) No results for input(s): PROBNP in the last 8760 hours. HbA1C:  Recent Labs  01/19/17 1628  HGBA1C 5.4   CBG:  Recent Labs Lab 01/20/17 1134 01/20/17 1636 01/20/17 1922 01/20/17 2312 01/21/17 0354  GLUCAP 98 91 94 99 95   Lipid Profile:  Recent Labs  01/19/17 1628  CHOL 117  HDL 57  LDLCALC 45  TRIG 75  CHOLHDL 2.1   Thyroid Function Tests: No results for input(s): TSH, T4TOTAL, FREET4, T3FREE, THYROIDAB in the last 72 hours. Anemia Panel: No results for input(s): VITAMINB12, FOLATE, FERRITIN, TIBC, IRON, RETICCTPCT in the last 72 hours. Urine analysis:    Component Value Date/Time   COLORURINE AMBER (A) 01/18/2017 0400   APPEARANCEUR CLOUDY (A) 01/18/2017 0400   LABSPEC 1.021 01/18/2017 0400   PHURINE 5.0 01/18/2017 0400   GLUCOSEU NEGATIVE 01/18/2017 0400   HGBUR LARGE (A) 01/18/2017 0400   BILIRUBINUR NEGATIVE 01/18/2017 0400   KETONESUR 5 (A) 01/18/2017 0400   PROTEINUR NEGATIVE 01/18/2017 0400   NITRITE NEGATIVE 01/18/2017 0400   LEUKOCYTESUR NEGATIVE 01/18/2017 0400   Sepsis Labs: @LABRCNTIP (procalcitonin:4,lacticidven:4)  ) Recent Results (from the past 240 hour(s))  MRSA PCR Screening     Status: None   Collection Time: 2017-01-28  6:48 PM  Result Value Ref Range Status   MRSA by PCR NEGATIVE NEGATIVE Final    Comment:        The GeneXpert MRSA Assay (FDA approved for NASAL specimens only), is one component of a comprehensive MRSA colonization surveillance program. It is not intended to diagnose MRSA infection nor to guide or monitor treatment for MRSA infections.          Radiology Studies: Dg Chest Port 1 View  Result Date: 01/20/2017 CLINICAL DATA:  Atelectasis EXAM: PORTABLE CHEST 1 VIEW COMPARISON:   01/17/2017 FINDINGS: Heart size and vascularity normal. Mild left lower lobe atelectasis unchanged. Negative for heart failure or effusion. IMPRESSION: Mild left lower lobe atelectasis unchanged. Electronically Signed   By: Marlan Palau M.D.   On: 01/20/2017 07:35        Scheduled Meds: . aspirin EC  81 mg Oral Daily  . clopidogrel  75 mg Oral Daily  . heparin  5,000 Units Subcutaneous Q8H  . insulin aspart  0-9 Units Subcutaneous Q4H  . mouth rinse  15 mL Mouth Rinse BID  .  ondansetron (ZOFRAN) IV  4 mg Intravenous Q6H  . pneumococcal 23 valent vaccine  0.5 mL Intramuscular Tomorrow-1000   Continuous Infusions: . dextrose 5 % and 0.9% NaCl 50 mL/hr at 01/20/17 2032  . lacosamide (VIMPAT) IV Stopped (01/21/17 0100)  . levETIRAcetam Stopped (01/21/17 0000)     LOS: 5 days    Time spent: 40 minutes    Anylah Scheib, Roselind MessierURTIS J, MD [facility] Pager [phone]   If 7PM-7AM, please contact night-coverage [email or web address] Password [email or web address] 01/21/2017, 8:10 AM

## 2017-01-21 NOTE — Progress Notes (Signed)
  Speech Language Pathology Treatment: Dysphagia  Patient Details Name: Sandra CorporalMigdalia Johnston MRN: [id number] DOB: 1946-03-10 Today's Date: 01/21/2017 Time: [id number] SLP Time Calculation (min) (ACUTE ONLY): 15 min  Assessment / Plan / Recommendation Clinical Impression  Pt sleepy today; able to be aroused for participation, but required constant cueing.  There was poor spontaneity, minimal verbalizations.  Pt with left oral pocketing/holding of POs, requiring frequent cues to swallow and attend to spillage left side of mouth.  Per RN, pt was much more alert at breakfast, communicative, and ate without difficulty.  Recommend continuing current diet - dysphagia 1, nectars - with full supervision, hand-over-hand assist to self-feed, and frequent checking for pocketing left side.  Please ensure mouth is clear before leaving pt alone. SLP will follow.    HPI HPI: 71 y.o. female with hx of CVA, Alzheimer's, admitted with focal onset seizure with secondary generalization - most likely from the underlying prior right frontal ischemic stroke. Intubated for airway protection but extubated same day.  MRI of head showed acute confluent white matter infarct in the upper right MCA   Pt underwent swallow assessment 10/17, revealing functional swallow, but RN reports increasing difficulty, coughing with POs, pills.  Swallow eval re-ordered.       SLP Plan  Continue with current plan of care       Recommendations  Diet recommendations: Dysphagia 1 (puree);Nectar-thick liquid Liquids provided via: Cup Medication Administration: Crushed with puree Supervision: Staff to assist with self feeding Compensations: Lingual sweep for clearance of pocketing                Oral Care Recommendations: Oral care BID SLP Visit Diagnosis: Dysphagia, unspecified (R13.10) Plan: Continue with current plan of care       GO                [name], [name] 01/21/2017, 1:14 PM

## 2017-01-21 NOTE — Progress Notes (Signed)
Chart review note  Echo results reviewed. PFO can not be r/o and TEE recommended.  Relayed this to Dr. Joseph ArtWoods who will be coordinating a TEE.  We will follow after TEE.  Milon DikesAshish Jolea Dolle, MD [facility] [phone] If 7pm to 7am, please call on call as listed on AMION.   - no charge

## 2017-01-21 NOTE — Care Management Note (Signed)
Case Management Note  Patient Details  Name: Marlyn CorporalMigdalia Donze MRN: [id number] Date of Birth: May 16, 1945  Subjective/Objective:                    Action/Plan: PT/OT recommending SNF. Pt is confused. CM called and spoke to her daughter, Dois DavenportSandra, on the phone. CM inquired about SNF rehab and she was interested. Dois DavenportSandra states the patient was living in [place] with her other daughter, Harriett Sineancy. They would like for her to be in SNF in [place] if possible but also open [facility]. They are agreeable to her being faxed out in both areas. CSW updated. CM following.  Expected Discharge Date:                  Expected Discharge Plan:  Skilled Nursing Facility  In-House Referral:  Clinical Social Work  Discharge planning Services  CM Consult  Post Acute Care Choice:    Choice offered to:     DME Arranged:    DME Agency:     HH Arranged:    HH Agency:     Status of Service:  In process, will continue to follow  If discussed at Long Length of Stay Meetings, dates discussed:    Additional Comments:  Kermit BaloKelli F Harlo Fabela, RN 01/21/2017, 11:25 AM

## 2017-01-22 DIAGNOSIS — R1312 Dysphagia, oropharyngeal phase: Secondary | ICD-10-CM

## 2017-01-22 LAB — GLUCOSE, CAPILLARY
GLUCOSE-CAPILLARY: 99 mg/dL (ref 65–99)
GLUCOSE-CAPILLARY: 96 mg/dL (ref 65–99)
GLUCOSE-CAPILLARY: 116 mg/dL — AB (ref 65–99)
Glucose-Capillary: 100 mg/dL — ABNORMAL HIGH (ref 65–99)
Glucose-Capillary: 112 mg/dL — ABNORMAL HIGH (ref 65–99)
Glucose-Capillary: 95 mg/dL (ref 65–99)

## 2017-01-22 LAB — BASIC METABOLIC PANEL
ANION GAP: 10 (ref 5–15)
BUN: <5 mg/dL — ABNORMAL LOW (ref 6–20)
CO2: 24 mmol/L (ref 22–32)
Calcium: 8.8 mg/dL — ABNORMAL LOW (ref 8.9–10.3)
Chloride: 101 mmol/L (ref 101–111)
Creatinine, Ser: 0.73 mg/dL (ref 0.44–1.00)
GLUCOSE: 103 mg/dL — AB (ref 65–99)
POTASSIUM: 3.3 mmol/L — AB (ref 3.5–5.1)
Sodium: 135 mmol/L (ref 135–145)

## 2017-01-22 LAB — CBC WITH DIFFERENTIAL/PLATELET
BASOS ABS: 0 10*3/uL (ref 0.0–0.1)
BASOS PCT: 0 %
Eosinophils Absolute: 0.3 10*3/uL (ref 0.0–0.7)
Eosinophils Relative: 2 %
HEMATOCRIT: 35.4 % — AB (ref 36.0–46.0)
HEMOGLOBIN: 11.6 g/dL — AB (ref 12.0–15.0)
LYMPHS PCT: 11 %
Lymphs Abs: 1.5 10*3/uL (ref 0.7–4.0)
MCH: 28.8 pg (ref 26.0–34.0)
MCHC: 32.8 g/dL (ref 30.0–36.0)
MCV: 87.8 fL (ref 78.0–100.0)
MONO ABS: 0.7 10*3/uL (ref 0.1–1.0)
MONOS PCT: 6 %
NEUTROS ABS: 11 10*3/uL — AB (ref 1.7–7.7)
NEUTROS PCT: 81 %
Platelets: 662 10*3/uL — ABNORMAL HIGH (ref 150–400)
RBC: 4.03 MIL/uL (ref 3.87–5.11)
RDW: 15 % (ref 11.5–15.5)
WBC: 13.5 10*3/uL — ABNORMAL HIGH (ref 4.0–10.5)

## 2017-01-22 LAB — PHOSPHORUS: PHOSPHORUS: 3 mg/dL (ref 2.5–4.6)

## 2017-01-22 LAB — MAGNESIUM: Magnesium: 1.7 mg/dL (ref 1.7–2.4)

## 2017-01-22 MED ORDER — POTASSIUM CHLORIDE CRYS ER 20 MEQ PO TBCR
60.0000 meq | EXTENDED_RELEASE_TABLET | Freq: Once | ORAL | Status: AC
  Administered 2017-01-22: 60 meq via ORAL
  Filled 2017-01-22: qty 3

## 2017-01-22 MED ORDER — MAGNESIUM SULFATE IN D5W 1-5 GM/100ML-% IV SOLN
1.0000 g | Freq: Once | INTRAVENOUS | Status: AC
  Administered 2017-01-22: 1 g via INTRAVENOUS
  Filled 2017-01-22: qty 100

## 2017-01-22 NOTE — Progress Notes (Signed)
PROGRESS NOTE    Sandra Johnston  WUJ:[id number] DOB: 19-Jan-1946 DOA: 02-08-2017 PCP: Charlott Rakes, MD   Brief Narrative:  71 year old WF PMHx Epilepsy, Progressive Alzheimer's Dementia.CVA July 2018 in [place] with residual LEFT Hemiparesis.  She moved recently to [place] to be with her daughter. As per the family she requires help with activities of daily living.   On 10/14 she developed altered mental status, sudden confusion with eye deviation and tonic-clonic activity. She was intubated due to poor mental status. CT scan shows changes consistent with old CVA. Neurology consulted and PCCM called for help with admission.    Subjective: 10/20 A/O 4, negative CP, negative SOB, states left leg hurts (describes pain as an ache). Follows all commands       Assessment & Plan:   Active Problems:   Seizures (HCC)  Acute CVA: Right MCA territory -CT Head: Shows acute/subacute stroke see results below -MRI shows multiple new acute/subacute infarcts see results below -patient was supposed to be on Plavix and aspirin at home unsure that patient was actually taking medication  as prescribed.  -10/18 Echocardgram: PFO could not be excluded.  -10/19 spoke with Trish who will schedule patient for Monday TEE -10/17 Hemoglobin A1c= 5.4 does not meet criteria for prediabetes/diabetes  -Lipid panel within AHA guideline   Seizure Disorder -24 hour NFA:OZHYQMVH FOR SEIZURE SEE RESULTS BELOW -Lacosamide 100 mg BID -Keppra 750 mg BID -Seizure precautions  Alzheimer's Dementia -stable? Unable to evale secondary to patient's CVA do not start new medication   Acute Urinary retention  -Continue Foley until patient's mental status improves  Leukocytosis - Afebrile last 24 hours, Stress response?. Negative bands, negative left shift. Will hold on starting antibiotics at this time -urine culture pending, blood cultures NGTD -10/18 PCXR negative infectious process see results  below  Dysphagia -Approved for dysphagia 1 diet fluid consistency nectar thick. -Most likely cannot satisfy nutritional requirements, begin calorie count. If cannot satisfy nutritional requirements place CorTrak 2; Awaiting completion of calorie count   Hyperglycemia  -(resolved); now HYPOglycemia continue D5-0.9% saline at 66ml/hr,   HYPOKALEMIA - potassium goal> 4 -K-Dur 60 mEq  Hypomagnesemia -magnesium goal> 2 -magnesium IV 1 g    DVT prophylaxis: subcutaneous heparin Code Status: full Family Communication: none Disposition Plan: TBD   Consultants:  Stroke team PC CM     Procedures/Significant Events:  10/14 Seizure 10/14 CT Head Wo Contrast (02-08-2017): - acute/subacute and chronic ischemia suspected in the right MCA territory. -No associated hemorrhage or mass effect.   10/14 CTA Head/Neck: -negative large vessel occlusion.-negative intracranial arterial stenosis or occlusion identified. -Positivefor patent right cervical carotid stent with in-stent stenosis at the level of the right ICA origin up to 65%. - Negative left carotid and bilateral vertebral arteries. 10/15 CXR : - Shallow lung inflation with mild bibasilar atelectasis. No other active cardiopulmonary disease. 10/15 Extubated 10/15 24 hour QIO:NGEXBMWU seizure. Background activity is abnormal due to persistent right hemispheric low amplitude delta slowing and poorly formed right frontal sharp waves.  These findings suggestive of neuronal dysfunction and some degree of cortical irritability in the RIGHT frontal region.  10/17 MRI head;-Large remote right MCA territory infarct centered at the posterior temporal and parietal lobes, but also extending along the insula and sylvian fissure.  -Hazy restricted diffusion in the right corona radiata and centrum semiovale with discrete infarct seen at the occipital parietal subcortical white matter on RIGHT. -chronic small vessel ischemia. - Remote lacunar infarcts  asymmetrically in the LEFT>>RIGHT more  than right thalamus. -Asymmetric injury in the right basal ganglia.  -Hemosiderin staining along the remote infarct. No acute hemorrhage or hydrocephalus. 10/18 PCXR: Negative infectious process 10/18 Echocardiogram: LVEF;:60% to 65%. - Atrial septum: A patent foramen ovale cannot be excluded.      I have personally reviewed and interpreted all radiology studies and my findings are as above.  VENTILATOR SETTINGS: None   Cultures 10/19 urine pending 10/19 blood culture NGTD 10/19 sputum culture    Antimicrobials: Anti-infectives    None       Devices    LINES / TUBES:  18G and 20G PIV's ETT 10/14 >>10/14 Foley 10/16 >>     Continuous Infusions: . dextrose 5 % and 0.9% NaCl 50 mL/hr at 01/22/17 1603  . lacosamide (VIMPAT) IV Stopped (01/22/17 0957)  . levETIRAcetam Stopped (01/22/17 0823)     Objective: Vitals:   01/22/17 0413 01/22/17 0500 01/22/17 1025 01/22/17 1419  BP: (!) 159/86  (!) 151/79 (!) 156/71  Pulse: 92  89 86  Resp: 18  17 18   Temp: 98.3 F (36.8 C)  99.4 F (37.4 C) 98.6 F (37 C)  TempSrc: Oral  Oral Oral  SpO2: 98%  94% 95%  Weight:  115 lb 14.4 oz (52.6 kg)    Height:        Intake/Output Summary (Last 24 hours) at 01/22/17 1639 Last data filed at 01/22/17 1250  Gross per 24 hour  Intake              160 ml  Output             1150 ml  Net             -990 ml   Filed Weights   01/20/17 0723 01/21/17 0456 01/22/17 0500  Weight: 114 lb 13.8 oz (52.1 kg) 118 lb 4.8 oz (53.7 kg) 115 lb 14.4 oz (52.6 kg)    Physical Exam:  General: A/O 4,No acute respiratory distress Eyes: negative scleral hemorrhage, negative anisocoria, negative icterus Cardiovascular: Regular rate and rhythm without murmur gallop or rub normal S1 and S2 Abdomen: negative abdominal pain, nondistended, positive soft, bowel sounds, no rebound, no ascites, no appreciable mass Extremities: No significant cyanosis,  clubbing, or edema bilateral lower extremities Skin: Negative rashes, lesions, ulcers Psychiatric:  nable to fully evaluate secondary to stroke Central nervous system:  Cranial nerves II through XII intact, tongue/uvula midline, RUE/RLE muscle strength 5/5 LLE strength 4/5. sensation intact throughout, unable to perform finger nose finger bilateral, quick finger touch bilateral  positive dysarthria (improving), positive expressive aphasia(improving), negative receptive aphasia.  .     Data Reviewed: Care during the described time interval was provided by me .  I have reviewed this patient's available data, including medical history, events of note, physical examination, and all test results as part of my evaluation.   CBC:  Recent Labs Lab 01/18/17 1044 01/19/17 0309 01/20/17 0518 01/21/17 0520 01/22/17 0445  WBC 9.6 15.1* 18.2* 15.1* 13.5*  NEUTROABS 6.6 12.2* 15.1* 12.5* 11.0*  HGB 11.7* 11.5* 11.3* 11.3* 11.6*  HCT 36.8 35.3* 35.1* 34.6* 35.4*  MCV 90.0 88.9 88.4 88.7 87.8  PLT 668* 643* 627* 651* 662*   Basic Metabolic Panel:  Recent Labs Lab 01/18/17 1044 01/19/17 0309 01/20/17 0518 01/21/17 0520 01/22/17 0445  NA 139 138 136 137 135  K 3.7 3.5 3.1* 3.7 3.3*  CL 112* 109 106 105 101  CO2 22 22 22 22 24   GLUCOSE 89 105*  101* 95 103*  BUN 16 12 8  <5* <5*  CREATININE 0.79 0.69 0.67 0.62 0.73  CALCIUM 9.1 9.0 8.8* 8.8* 8.8*  MG 1.9 1.9 1.5* 1.9 1.7  PHOS 2.5 2.2* 3.1 2.7 3.0   GFR: Estimated Creatinine Clearance: 47.9 mL/min (by C-G formula based on SCr of 0.73 mg/dL). Liver Function Tests:  Recent Labs Lab 01/20/2017 1500  AST 37  ALT 28  ALKPHOS 95  BILITOT 0.8  PROT 7.7  ALBUMIN 3.9   No results for input(s): LIPASE, AMYLASE in the last 168 hours. No results for input(s): AMMONIA in the last 168 hours. Coagulation Profile:  Recent Labs Lab 01/25/2017 1500  INR 1.04   Cardiac Enzymes:  Recent Labs Lab 01/15/2017 1824 01/14/2017 2147 01/17/17 0517   TROPONINI <0.03 <0.03 0.03*   BNP (last 3 results) No results for input(s): PROBNP in the last 8760 hours. HbA1C: No results for input(s): HGBA1C in the last 72 hours. CBG:  Recent Labs Lab 01/22/17 0001 01/22/17 0413 01/22/17 0815 01/22/17 1145 01/22/17 1637  GLUCAP 102* 99 112* 95 96   Lipid Profile: No results for input(s): CHOL, HDL, LDLCALC, TRIG, CHOLHDL, LDLDIRECT in the last 72 hours. Thyroid Function Tests: No results for input(s): TSH, T4TOTAL, FREET4, T3FREE, THYROIDAB in the last 72 hours. Anemia Panel: No results for input(s): VITAMINB12, FOLATE, FERRITIN, TIBC, IRON, RETICCTPCT in the last 72 hours. Urine analysis:    Component Value Date/Time   COLORURINE AMBER (A) 01/18/2017 0400   APPEARANCEUR CLOUDY (A) 01/18/2017 0400   LABSPEC 1.021 01/18/2017 0400   PHURINE 5.0 01/18/2017 0400   GLUCOSEU NEGATIVE 01/18/2017 0400   HGBUR LARGE (A) 01/18/2017 0400   BILIRUBINUR NEGATIVE 01/18/2017 0400   KETONESUR 5 (A) 01/18/2017 0400   PROTEINUR NEGATIVE 01/18/2017 0400   NITRITE NEGATIVE 01/18/2017 0400   LEUKOCYTESUR NEGATIVE 01/18/2017 0400   Sepsis Labs: @LABRCNTIP (procalcitonin:4,lacticidven:4)  ) Recent Results (from the past 240 hour(s))  MRSA PCR Screening     Status: None   Collection Time: 01/10/2017  6:48 PM  Result Value Ref Range Status   MRSA by PCR NEGATIVE NEGATIVE Final    Comment:        The GeneXpert MRSA Assay (FDA approved for NASAL specimens only), is one component of a comprehensive MRSA colonization surveillance program. It is not intended to diagnose MRSA infection nor to guide or monitor treatment for MRSA infections.   Culture, blood (routine x 2)     Status: None (Preliminary result)   Collection Time: 01/21/17  3:19 PM  Result Value Ref Range Status   Specimen Description BLOOD LEFT HAND  Final   Special Requests   Final    BOTTLES DRAWN AEROBIC ONLY Blood Culture adequate volume   Culture NO GROWTH < 24 HOURS  Final    Report Status PENDING  Incomplete  Culture, blood (routine x 2)     Status: None (Preliminary result)   Collection Time: 01/21/17  3:22 PM  Result Value Ref Range Status   Specimen Description BLOOD LEFT HAND  Final   Special Requests   Final    BOTTLES DRAWN AEROBIC ONLY Blood Culture adequate volume   Culture NO GROWTH < 24 HOURS  Final   Report Status PENDING  Incomplete         Radiology Studies: No results found.      Scheduled Meds: . aspirin EC  81 mg Oral Daily  . clopidogrel  75 mg Oral Daily  . heparin  5,000 Units Subcutaneous Q8H  .  insulin aspart  0-9 Units Subcutaneous Q4H  . mouth rinse  15 mL Mouth Rinse BID  . ondansetron (ZOFRAN) IV  4 mg Intravenous Q6H   Continuous Infusions: . dextrose 5 % and 0.9% NaCl 50 mL/hr at 01/22/17 1603  . lacosamide (VIMPAT) IV Stopped (01/22/17 0957)  . levETIRAcetam Stopped (01/22/17 0823)     LOS: 6 days    Time spent: 40 minutes    [place] Kretz, Roselind MessierURTIS J, MD [facility] Pager [phone]   If 7PM-7AM, please contact night-coverage [email or web address] Password [email or web address] 01/22/2017, 4:39 PM

## 2017-01-22 NOTE — Plan of Care (Signed)
Problem: Safety: Goal: Ability to remain free from injury will improve Outcome: Progressing Bed in low position, bed alarm on, non skid socks on, hourly rounds done.   

## 2017-01-23 DIAGNOSIS — N390 Urinary tract infection, site not specified: Secondary | ICD-10-CM

## 2017-01-23 DIAGNOSIS — Z1612 Extended spectrum beta lactamase (ESBL) resistance: Secondary | ICD-10-CM

## 2017-01-23 DIAGNOSIS — B9629 Other Escherichia coli [E. coli] as the cause of diseases classified elsewhere: Secondary | ICD-10-CM

## 2017-01-23 LAB — GLUCOSE, CAPILLARY
GLUCOSE-CAPILLARY: 102 mg/dL — AB (ref 65–99)
GLUCOSE-CAPILLARY: 123 mg/dL — AB (ref 65–99)
GLUCOSE-CAPILLARY: 87 mg/dL (ref 65–99)
Glucose-Capillary: 99 mg/dL (ref 65–99)
Glucose-Capillary: 95 mg/dL (ref 65–99)

## 2017-01-23 LAB — CBC WITH DIFFERENTIAL/PLATELET
BASOS PCT: 0 %
Basophils Absolute: 0 10*3/uL (ref 0.0–0.1)
EOS ABS: 0.5 10*3/uL (ref 0.0–0.7)
EOS PCT: 4 %
HCT: 34.2 % — ABNORMAL LOW (ref 36.0–46.0)
HEMOGLOBIN: 11.8 g/dL — AB (ref 12.0–15.0)
Lymphocytes Relative: 15 %
Lymphs Abs: 1.9 10*3/uL (ref 0.7–4.0)
MCH: 30.2 pg (ref 26.0–34.0)
MCHC: 34.5 g/dL (ref 30.0–36.0)
MCV: 87.5 fL (ref 78.0–100.0)
Monocytes Absolute: 0.9 10*3/uL (ref 0.1–1.0)
Monocytes Relative: 7 %
NEUTROS PCT: 74 %
Neutro Abs: 9.5 10*3/uL — ABNORMAL HIGH (ref 1.7–7.7)
PLATELETS: 716 10*3/uL — AB (ref 150–400)
RBC: 3.91 MIL/uL (ref 3.87–5.11)
RDW: 15.1 % (ref 11.5–15.5)
WBC: 12.8 10*3/uL — AB (ref 4.0–10.5)

## 2017-01-23 LAB — PHOSPHORUS: PHOSPHORUS: 3.5 mg/dL (ref 2.5–4.6)

## 2017-01-23 LAB — BASIC METABOLIC PANEL
Anion gap: 7 (ref 5–15)
BUN: <5 mg/dL — ABNORMAL LOW (ref 6–20)
CALCIUM: 9 mg/dL (ref 8.9–10.3)
CO2: 26 mmol/L (ref 22–32)
CREATININE: 0.7 mg/dL (ref 0.44–1.00)
Chloride: 103 mmol/L (ref 101–111)
GFR calc non Af Amer: >60 mL/min (ref >60)
Glucose, Bld: 104 mg/dL — ABNORMAL HIGH (ref 65–99)
Potassium: 3.4 mmol/L — ABNORMAL LOW (ref 3.5–5.1)
SODIUM: 136 mmol/L (ref 135–145)

## 2017-01-23 LAB — MAGNESIUM: MAGNESIUM: 1.9 mg/dL (ref 1.7–2.4)

## 2017-01-23 MED ORDER — CEFTRIAXONE SODIUM 1 G IJ SOLR
1.0000 g | INTRAMUSCULAR | Status: DC
  Administered 2017-01-23: 1 g via INTRAVENOUS
  Filled 2017-01-23 (×2): qty 10

## 2017-01-23 MED ORDER — POTASSIUM CHLORIDE CRYS ER 20 MEQ PO TBCR
40.0000 meq | EXTENDED_RELEASE_TABLET | Freq: Two times a day (BID) | ORAL | Status: AC
  Administered 2017-01-23 (×2): 40 meq via ORAL
  Filled 2017-01-23 (×2): qty 2

## 2017-01-23 NOTE — Progress Notes (Signed)
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  She moved recently to [place] to be with her daughter. As per the family she requires help with activities of daily living.   On 10/14 she developed altered mental status, sudden confusion with eye deviation and tonic-clonic activity. She was intubated due to poor mental status. CT scan shows changes consistent with old CVA. Neurology consulted and PCCM called for help with admission.    Subjective: 10/21 A/O 2 (does not know where, when) does know she is in the hospital. Negative CP, negative SOB, negative abdominal pain.      Assessment & Plan:   Active Problems:   Seizures (HCC)  Acute CVA: Right MCA territory -CT Head: Shows acute/subacute stroke see results below -MRI shows multiple new acute/subacute infarcts see results below -patient was supposed to be on Plavix and aspirin at home unsure that patient was actually taking medication  as prescribed.  -10/18 Echocardgram: PFO could not be excluded.  -10/19 spoke with Trish who will schedule patient for Monday TEE -10/17Hemoglobin A1c= 5.4does not meet criteria for prediabetes/diabetes  -Lipid panel within AHA guideline   Seizure Disorder -24 hour WJX:BJYNWGNF FOR SEIZURE SEE RESULTS BELOW -lacosamide 100 mg BID -Keppra 750 mg BID -seizure precautions  Alzheimer's Dementia -stable? Unable to evale secondary to patient's CVA do not start new medication  UTI positive Escherichia coli -Ceftriaxone per pharmacy   Acute Urinary retention  -10/21 DC Foley catheter given patient's UTI. Although patient not a baseline believe cognition has improved enough for her to ask for that pain. If required place female wick catheter  Leukocytosis -afebrile last 24 hours,, negative  bands, negative left shift. Most likely secondary to UTI.  -10/18 PCXR negative infectious process see results below  Dysphagia -Approved for dysphagia 1 diet fluid consistency nectar thick. -Most likely cannot satisfy nutritional requirements. Calorie count should be completed today On 11/22 will make decision on placement of CorTrak tube.   Hyperglycemia  -(resolved); now HYPOglycemia continue D5-0.9% saline at 4ml/hr,   HYPOKALEMIA - potassium goal> 4 -K-Dur 40 mEq x 2 doses  Hypomagnesemia -magnesium goal> 2     DVT prophylaxis: subcutaneous heparin Code Status: full Family Communication: none Disposition Plan: TBD   Consultants:  Stroke team PC CM     Procedures/Significant Events:  10/14 Seizure 10/14 CT Head Wo Contrast (25-Jan-2017): - acute/subacute and chronic ischemia suspected in the right MCA territory. -No associated hemorrhage or mass effect.   10/14 CTA Head/Neck: -negative large vessel occlusion.-negative intracranial arterial stenosis or occlusion identified. -Positivefor patent right cervical carotid stent with in-stent stenosis at the level of the right ICA origin up to 65%. - Negative left carotid and bilateral vertebral arteries. 10/15 CXR : - Shallow lung inflation with mild bibasilar atelectasis. No other active cardiopulmonary disease. 10/15 Extubated 10/15 24 hour AOZ:HYQMVHQI seizure. Background activity is abnormal due to persistent right hemispheric low amplitude delta slowing and poorly formed right frontal sharp waves.  These findings suggestive of neuronal dysfunction and some degree of cortical irritability in the RIGHT frontal region.  10/17 MRI head;-Large remote right MCA territory infarct centered at the posterior temporal and parietal lobes, but also extending along the insula and sylvian fissure.  -Hazy restricted diffusion in the right corona radiata and centrum semiovale with discrete infarct seen at the occipital  parietal subcortical  white matter on RIGHT. -chronic small vessel ischemia. - Remote lacunar infarcts asymmetrically in the LEFT>>RIGHT more than right thalamus. -Asymmetric injury in the right basal ganglia.  -Hemosiderin staining along the remote infarct. No acute hemorrhage or hydrocephalus. 10/18 PCXR: Negative infectious process 10/18 Echocardiogram: LVEF;:60% to 65%. - Atrial septum: A patent foramen ovale cannot be excluded.      I have personally reviewed and interpreted all radiology studies and my findings are as above.  VENTILATOR SETTINGS: None   Cultures 10/19 urine positive Escherichia coli 10/19 blood culture NGTD 10/19 sputum culture    Antimicrobials: Anti-infectives    None       Devices    LINES / TUBES:  18G and 20G PIV's ETT 10/14 >>10/14 Foley 10/16 >>     Continuous Infusions: . dextrose 5 % and 0.9% NaCl 50 mL/hr at 01/22/17 1603  . lacosamide (VIMPAT) IV Stopped (01/22/17 2310)  . levETIRAcetam Stopped (01/22/17 2254)     Objective: Vitals:   01/22/17 2100 01/23/17 0000 01/23/17 0412 01/23/17 0500  BP: (!) 149/73 (!) 168/93 (!) 162/74   Pulse: 89 89 84   Resp: 18 18 18    Temp: 98.8 F (37.1 C) 98.3 F (36.8 C) 99.2 F (37.3 C)   TempSrc: Oral Oral Oral   SpO2: 94% 97% 91%   Weight:    112 lb 4.8 oz (50.9 kg)  Height:        Intake/Output Summary (Last 24 hours) at 01/23/17 [id number] Last data filed at 01/23/17 0415  Gross per 24 hour  Intake            792.5 ml  Output             1950 ml  Net          -1157.5 ml   Filed Weights   01/21/17 0456 01/22/17 0500 01/23/17 0500  Weight: 118 lb 4.8 oz (53.7 kg) 115 lb 14.4 oz (52.6 kg) 112 lb 4.8 oz (50.9 kg)    Physical Exam:  General: A/O 2 (does not know where,when) does  know she is in the hospital .No acute respiratory distress Eyes: negative scleral hemorrhage, negative anisocoria, negative icterus Cardiovascular: Regular rate and rhythm without murmur gallop or rub normal S1 and  S2 Abdomen: negative abdominal pain, nondistended, positive soft, bowel sounds, no rebound, no ascites, no appreciable mass Extremities: No significant cyanosis, clubbing, or edema bilateral lower extremities Skin: Negative rashes, lesions, ulcers Psychiatric:  nable to fully evaluate secondary to stroke Central nervous system:  Cranial nerves II through XII intact, tongue/uvula midline, RUE/RLE muscle strength 4/5, LLE muscle strength 3/5, LUE muscle strength 0/5 (however patient able to flex/extend fingers on command). sensation intact throughout, unable to perform finger nose finger bilateral, quick finger touch bilateral  negative dysarthria, negative expressive aphasia, negative receptive aphasia.  .     Data Reviewed: Care during the described time interval was provided by me .  I have reviewed this patient's available data, including medical history, events of note, physical examination, and all test results as part of my evaluation.   CBC:  Recent Labs Lab 01/19/17 0309 01/20/17 0518 01/21/17 0520 01/22/17 0445 01/23/17 0324  WBC 15.1* 18.2* 15.1* 13.5* 12.8*  NEUTROABS 12.2* 15.1* 12.5* 11.0* 9.5*  HGB 11.5* 11.3* 11.3* 11.6* 11.8*  HCT 35.3* 35.1* 34.6* 35.4* 34.2*  MCV 88.9 88.4 88.7 87.8 87.5  PLT 643* 627* 651* 662* 716*   Basic Metabolic Panel:  Recent Labs Lab  01/19/17 0309 01/20/17 0518 01/21/17 0520 01/22/17 0445 01/23/17 0324  NA 138 136 137 135 136  K 3.5 3.1* 3.7 3.3* 3.4*  CL 109 106 105 101 103  CO2 22 22 22 24 26   GLUCOSE 105* 101* 95 103* 104*  BUN 12 8 <5* <5* <5*  CREATININE 0.69 0.67 0.62 0.73 0.70  CALCIUM 9.0 8.8* 8.8* 8.8* 9.0  MG 1.9 1.5* 1.9 1.7 1.9  PHOS 2.2* 3.1 2.7 3.0 3.5   GFR: Estimated Creatinine Clearance: 44 mL/min (by C-G formula based on SCr of 0.7 mg/dL). Liver Function Tests:  Recent Labs Lab January 20, 2017 1500  AST 37  ALT 28  ALKPHOS 95  BILITOT 0.8  PROT 7.7  ALBUMIN 3.9   No results for input(s): LIPASE,  AMYLASE in the last 168 hours. No results for input(s): AMMONIA in the last 168 hours. Coagulation Profile:  Recent Labs Lab Jan 20, 2017 1500  INR 1.04   Cardiac Enzymes:  Recent Labs Lab 01/20/2017 1824 Jan 20, 2017 2147 01/17/17 0517  TROPONINI <0.03 <0.03 0.03*   BNP (last 3 results) No results for input(s): PROBNP in the last 8760 hours. HbA1C: No results for input(s): HGBA1C in the last 72 hours. CBG:  Recent Labs Lab 01/22/17 1637 01/22/17 1957 01/23/17 0001 01/23/17 0410 01/23/17 0756  GLUCAP 96 116* 100* 102* 99   Lipid Profile: No results for input(s): CHOL, HDL, LDLCALC, TRIG, CHOLHDL, LDLDIRECT in the last 72 hours. Thyroid Function Tests: No results for input(s): TSH, T4TOTAL, FREET4, T3FREE, THYROIDAB in the last 72 hours. Anemia Panel: No results for input(s): VITAMINB12, FOLATE, FERRITIN, TIBC, IRON, RETICCTPCT in the last 72 hours. Urine analysis:    Component Value Date/Time   COLORURINE AMBER (A) 01/18/2017 0400   APPEARANCEUR CLOUDY (A) 01/18/2017 0400   LABSPEC 1.021 01/18/2017 0400   PHURINE 5.0 01/18/2017 0400   GLUCOSEU NEGATIVE 01/18/2017 0400   HGBUR LARGE (A) 01/18/2017 0400   BILIRUBINUR NEGATIVE 01/18/2017 0400   KETONESUR 5 (A) 01/18/2017 0400   PROTEINUR NEGATIVE 01/18/2017 0400   NITRITE NEGATIVE 01/18/2017 0400   LEUKOCYTESUR NEGATIVE 01/18/2017 0400   Sepsis Labs: @LABRCNTIP (procalcitonin:4,lacticidven:4)  ) Recent Results (from the past 240 hour(s))  MRSA PCR Screening     Status: None   Collection Time: January 20, 2017  6:48 PM  Result Value Ref Range Status   MRSA by PCR NEGATIVE NEGATIVE Final    Comment:        The GeneXpert MRSA Assay (FDA approved for NASAL specimens only), is one component of a comprehensive MRSA colonization surveillance program. It is not intended to diagnose MRSA infection nor to guide or monitor treatment for MRSA infections.   Culture, blood (routine x 2)     Status: None (Preliminary result)    Collection Time: 01/21/17  3:19 PM  Result Value Ref Range Status   Specimen Description BLOOD LEFT HAND  Final   Special Requests   Final    BOTTLES DRAWN AEROBIC ONLY Blood Culture adequate volume   Culture NO GROWTH < 24 HOURS  Final   Report Status PENDING  Incomplete  Culture, blood (routine x 2)     Status: None (Preliminary result)   Collection Time: 01/21/17  3:22 PM  Result Value Ref Range Status   Specimen Description BLOOD LEFT HAND  Final   Special Requests   Final    BOTTLES DRAWN AEROBIC ONLY Blood Culture adequate volume   Culture NO GROWTH < 24 HOURS  Final   Report Status PENDING  Incomplete  Radiology Studies: No results found.      Scheduled Meds: . aspirin EC  81 mg Oral Daily  . clopidogrel  75 mg Oral Daily  . heparin  5,000 Units Subcutaneous Q8H  . insulin aspart  0-9 Units Subcutaneous Q4H  . mouth rinse  15 mL Mouth Rinse BID  . ondansetron (ZOFRAN) IV  4 mg Intravenous Q6H   Continuous Infusions: . dextrose 5 % and 0.9% NaCl 50 mL/hr at 01/22/17 1603  . lacosamide (VIMPAT) IV Stopped (01/22/17 2310)  . levETIRAcetam Stopped (01/22/17 2254)     LOS: 7 days    Time spent: 40 minutes    Ariany Kesselman, Roselind Messier, MD [facility] Pager [phone]   If 7PM-7AM, please contact night-coverage [email or web address] Password [email or web address] 01/23/2017, 8:08 AM

## 2017-01-24 ENCOUNTER — Encounter (HOSPITAL_COMMUNITY): Admission: EM | Disposition: E | Payer: Self-pay | Source: Home / Self Care | Attending: Internal Medicine

## 2017-01-24 ENCOUNTER — Other Ambulatory Visit (HOSPITAL_COMMUNITY): Payer: Medicare (Managed Care)

## 2017-01-24 ENCOUNTER — Inpatient Hospital Stay (HOSPITAL_COMMUNITY): Payer: Medicare (Managed Care)

## 2017-01-24 ENCOUNTER — Ambulatory Visit (HOSPITAL_COMMUNITY): Admission: RE | Admit: 2017-01-24 | Payer: Medicare (Managed Care) | Source: Ambulatory Visit | Admitting: Cardiology

## 2017-01-24 DIAGNOSIS — J9601 Acute respiratory failure with hypoxia: Secondary | ICD-10-CM

## 2017-01-24 DIAGNOSIS — I469 Cardiac arrest, cause unspecified: Secondary | ICD-10-CM

## 2017-01-24 DIAGNOSIS — R570 Cardiogenic shock: Secondary | ICD-10-CM

## 2017-01-24 DIAGNOSIS — G9340 Encephalopathy, unspecified: Secondary | ICD-10-CM

## 2017-01-24 LAB — GLUCOSE, CAPILLARY
GLUCOSE-CAPILLARY: 103 mg/dL — AB (ref 65–99)
GLUCOSE-CAPILLARY: 108 mg/dL — AB (ref 65–99)
Glucose-Capillary: 150 mg/dL — ABNORMAL HIGH (ref 65–99)

## 2017-01-24 LAB — CBC WITH DIFFERENTIAL/PLATELET
Basophils Absolute: 0.1 10*3/uL (ref 0.0–0.1)
Basophils Relative: 0 %
EOS ABS: 0.1 10*3/uL (ref 0.0–0.7)
Eosinophils Relative: 1 %
HCT: 34.4 % — ABNORMAL LOW (ref 36.0–46.0)
Hemoglobin: 11.3 g/dL — ABNORMAL LOW (ref 12.0–15.0)
LYMPHS ABS: 1.1 10*3/uL (ref 0.7–4.0)
LYMPHS PCT: 8 %
MCH: 28.8 pg (ref 26.0–34.0)
MCHC: 32.8 g/dL (ref 30.0–36.0)
MCV: 87.8 fL (ref 78.0–100.0)
MONOS PCT: 5 %
Monocytes Absolute: 0.7 10*3/uL (ref 0.1–1.0)
Neutro Abs: 12.4 10*3/uL — ABNORMAL HIGH (ref 1.7–7.7)
Neutrophils Relative %: 86 %
PLATELETS: 794 10*3/uL — AB (ref 150–400)
RBC: 3.92 MIL/uL (ref 3.87–5.11)
RDW: 15.2 % (ref 11.5–15.5)
WBC: 14.4 10*3/uL — AB (ref 4.0–10.5)

## 2017-01-24 LAB — BASIC METABOLIC PANEL
Anion gap: 8 (ref 5–15)
CHLORIDE: 101 mmol/L (ref 101–111)
CO2: 24 mmol/L (ref 22–32)
CREATININE: 0.77 mg/dL (ref 0.44–1.00)
Calcium: 8.9 mg/dL (ref 8.9–10.3)
GFR calc Af Amer: >60 mL/min (ref >60)
GFR calc non Af Amer: >60 mL/min (ref >60)
GLUCOSE: 136 mg/dL — AB (ref 65–99)
POTASSIUM: 3.7 mmol/L (ref 3.5–5.1)
SODIUM: 133 mmol/L — AB (ref 135–145)

## 2017-01-24 LAB — POCT I-STAT 3, ART BLOOD GAS (G3+)
ACID-BASE DEFICIT: 1 mmol/L (ref 0.0–2.0)
BICARBONATE: 25 mmol/L (ref 20.0–28.0)
O2 SAT: 100 %
TCO2: 26 mmol/L (ref 22–32)
pCO2 arterial: 43.9 mmHg (ref 32.0–48.0)
pH, Arterial: 7.363 (ref 7.350–7.450)
pO2, Arterial: 510 mmHg — ABNORMAL HIGH (ref 83.0–108.0)

## 2017-01-24 LAB — URINE CULTURE

## 2017-01-24 LAB — PHOSPHORUS: Phosphorus: 2.7 mg/dL (ref 2.5–4.6)

## 2017-01-24 LAB — MAGNESIUM: Magnesium: 1.8 mg/dL (ref 1.7–2.4)

## 2017-01-24 SURGERY — ECHOCARDIOGRAM, TRANSESOPHAGEAL
Anesthesia: Monitor Anesthesia Care

## 2017-01-24 MED ORDER — LORAZEPAM 2 MG/ML IJ SOLN: 1.0000 mg | Freq: Once | INTRAMUSCULAR | Status: AC

## 2017-01-24 MED ORDER — SODIUM CHLORIDE 0.9 % IV SOLN
500.0000 mg | Freq: Once | INTRAVENOUS | Status: AC
  Administered 2017-01-24: 500 mg via INTRAVENOUS
  Filled 2017-01-24: qty 5

## 2017-01-24 MED ORDER — SODIUM CHLORIDE 0.9 % IV SOLN
1000.0000 mg | Freq: Two times a day (BID) | INTRAVENOUS | Status: DC
  Filled 2017-01-24: qty 10

## 2017-01-24 MED ORDER — PHENYLEPHRINE HCL 10 MG/ML IJ SOLN
0.0000 ug/min | INTRAMUSCULAR | Status: DC
  Filled 2017-01-24 (×2): qty 1

## 2017-01-24 MED ORDER — LORAZEPAM 2 MG/ML IJ SOLN
INTRAMUSCULAR | Status: AC
  Administered 2017-01-24: 1 mg
  Filled 2017-01-24: qty 1

## 2017-01-24 MED ORDER — LORAZEPAM 2 MG/ML IJ SOLN
1.0000 mg | Freq: Once | INTRAMUSCULAR | Status: AC
  Administered 2017-01-24: 1 mg via INTRAVENOUS
  Filled 2017-01-24: qty 1

## 2017-01-24 MED ORDER — SODIUM CHLORIDE 0.9 % IV SOLN
INTRAVENOUS | Status: DC
  Administered 2017-01-24: 10:00:00 via INTRAVENOUS

## 2017-01-25 MED FILL — Medication: Qty: 2 | Status: AC

## 2017-01-26 LAB — CULTURE, BLOOD (ROUTINE X 2)
CULTURE: NO GROWTH
Culture: NO GROWTH
Special Requests: ADEQUATE
Special Requests: ADEQUATE

## 2017-01-27 ENCOUNTER — Telehealth: Payer: Self-pay

## 2017-01-27 NOTE — Telephone Encounter (Signed)
On 01/27/17 I received a d/c from [employer] (original). The d/c is for cremation. The patient is a patient of Doctor Molli KnockYacoub. On 01/28/17 I received the d/c back from Doctor Craige CottaSood who signed the d/c for Doctor Molli KnockYacoub. I got the d/c ready and called the funeral home to let them know the d/c is ready for pickup. I also faxed a copy to the funeral home per the funeral home request.

## 2017-01-28 ENCOUNTER — Encounter: Payer: Self-pay | Admitting: Pulmonary Disease

## 2017-02-03 NOTE — Progress Notes (Signed)
Pt voided around 2130. Unmeasured amount. Pt placed on bedpan to see if able to void again at 2230. Pt unable to void.  Pt has vaginal discharge and unable to void. Pt is itching and attempting to scratch vaginal area.  Perineal care given.  RN scheduled to move to another unit.  Report given to new RN and asked to complete a bladder scan as pt has voided small amounts since foley being removed.

## 2017-02-03 NOTE — Progress Notes (Signed)
   Jun 01, 2016 0800  Clinical Encounter Type  Visited With Family  Visit Type Code (phone call)  Referral From Nurse   Called daughter Dois DavenportSandra who is local and left message alerting her that the mom was having a cardiac emergency. Left message for daughter in [place] also. Called Dois DavenportSandra a second time and she reported that she would be at the hospital in 20 minutes. Other daughter called back, very distressed, but chaplain reassured her that everything that could be done was being done.

## 2017-02-03 NOTE — Progress Notes (Signed)
Subjective: Patient had seizure overnight, then PEA arrest this am.   Exam: Vitals:   01/01/2017 0414 01/01/2017 0458  BP: (!) 153/75 (!) 148/72  Pulse:    Resp:    Temp:    SpO2:     Gen: In bed, intubated Resp: ventilated Abd: soft, nt  Neuro: MS: does not open eyes or follow commands.  ZO:XWRUEAVWCN:Possible small amount of reaction(6 -> 5) in the right pupil, fixed left pupil, no corneal response Motor: no motor response to nox stim Sensory:as above.   Pertinent Labs: BMP - unremarkable  Impression: 71 yo F with seizures in the setting of previous stroke, dementia, acute component superimposed on chronci stroke findings. I am actually not sure how much of the findings on MRI were acute vs shine through. No wiwth markedly worsened exam likley due to PEA arrest due to possible aspiration. Her exam is currently very concerning, but too early for any type of prognostication. One other possibility would be that this was an intracranial hemorrhage, but I feel this is less likley.   Recommendations: 1) Continue AEDs, keppra 1g BID, increase vimpat to 150mg  BID 2) EEG 3) CT head once stable to travel 4) will follow.   Ritta SlotMcNeill Heidemarie Goodnow, MD [facility] [phone]  If 7pm- 7am, please page neurology on call as listed in AMION.

## 2017-02-03 NOTE — Progress Notes (Signed)
Called [facility], spoke w/ Sandra BienenstockMelissa Johnston. Pt is not eligible for donation. Ref #[id number]. Pt may be released to funeral home.  Called Medical Examiner, pt is not a ME case and may be released to funeral home.

## 2017-02-03 NOTE — Progress Notes (Addendum)
  Patient Name: Sandra CorporalMigdalia Johnston   MRN: [id number]   Date of Birth/ Sex: 03-Jun-1945 , female      Admission Date: 19-May-2016  Attending Provider: Alyson ReedyYacoub, Wesam G, MD  Primary Diagnosis: <principal problem not specified>   Indication: Pt was in her usual state of health until this AM, when she was noted to be unresponsive. Code blue was subsequently called. At the time of arrival on scene, ACLS protocol was underway.    Technical Description:  - CPR performance duration:  Yes minutes  - Was defibrillation or cardioversion used? No   - Was external pacer placed? No  - Was patient intubated pre/post CPR? Yes   Medications Administered: Y = Yes; Blank = No Amiodarone    Atropine    Calcium    Epinephrine  Y  Lidocaine    Magnesium    Norepinephrine    Phenylephrine    Sodium bicarbonate    Vasopressin     Post CPR evaluation:  - Final Status - Was patient successfully resuscitated ? Yes - What is current rhythm? Sinus - What is current hemodynamic status? Stable  Miscellaneous Information:  - Labs sent, including: None  - Primary team notified?  Yes  - Family Notified? Yes  - Additional notes/ transfer status: Per nurse, received notification from central tele that the patient was brady. Went to check on patient noticed to be unresponsive with emesis surrounding the patient. Patient was admitted for seizure, subsequently experienced a CVA. Given 3 mg Ativan for seizure like activity this AM. After intubation a significant about of dark mucus was suctioned. Initially asystole, patient received 1 dose of Epi prior to ROSC. Transferred to Neuro ICU.      Levora DredgeHelberg, Mekhai Venuto, MD  01/19/2017, 10:22 AM

## 2017-02-03 NOTE — Procedures (Signed)
CPR Note  Patient suffered a bradycardic cardiac arrest, CPR started 1 dose of epi, able to establish ROSC but continues to be profoundly hypotensive.  Alyson ReedyWesam G. Yacoub, M.D. [facility] Medicine. Pager: [phone]. After hours pager: [phone].

## 2017-02-03 NOTE — Progress Notes (Signed)
Spoke with both daughters and husband at length, after discussion, patient has had a very poor quality of life after he last stroke and that her dementia has gotten a lot worse.  After a long discussion, decision was made to make patient a full DNR with no further escalation of care.  Will obtain a head CT only if patient remains hemodynamically stable after an hour or so.    Alyson ReedyWesam G. Yacoub, M.D. [facility]. Pager: [phone]. After hours pager: [phone].

## 2017-02-03 NOTE — Procedures (Signed)
Intubation Procedure Note Sandra Johnston [id number] 12-10-1945  Procedure: Intubation Indications: Airway protection and maintenance  Procedure Details Consent: Unable to obtain consent because of emergent medical necessity. Time Out: Verified patient identification, verified procedure, site/side was marked, verified correct patient position, special equipment/implants available, medications/allergies/relevent history reviewed, required imaging and test results available.  Performed  Maximum sterile technique was used including gloves.  MAC and 4    Evaluation Hemodynamic Status: Transient hypertension requiring treatment; O2 sats: currently acceptable Patient's Current Condition: unstable Complications: No apparent complications Patient did tolerate procedure well. Chest X-ray ordered to verify placement.  CXR: tube position acceptable  This procedure occurred during CPR/code blue, placement was confirmed with zoll end tital co2 detector.Revonda Standard 01/26/17

## 2017-02-03 NOTE — Significant Event (Signed)
Called by nursing for recurrence of seizure. Patient with tremulous movements and RUE tonic extensor posturing in conjunction with AMS. 1 mg Ativan without cessation of seizure activity. Additional 1 mg ordered with cessation of tremulousness but still with RUE extensor posturing. An additional 1 mg Ativan is being administered. 500 mg supplemental IV dose of Keppra now. Will increase scheduled Keppra to 1000 mg BID. Has normal renal function.   Electronically signed: Dr. Caryl PinaEric Jaeley Wiker

## 2017-02-03 NOTE — Procedures (Signed)
Arterial Catheter Insertion Procedure Note Sandra Johnston [id number] Nov 17, 1945  Procedure: Insertion of Arterial Catheter  Indications: Blood pressure monitoring and Frequent blood sampling  Procedure Details Consent: Unable to obtain consent because of emergent medical necessity. Time Out: Verified patient identification, verified procedure, site/side was marked, verified correct patient position, special equipment/implants available, medications/allergies/relevent history reviewed, required imaging and test results available.  Performed  Maximum sterile technique was used including antiseptics, cap, gloves, gown, hand hygiene, mask and sheet. Skin prep: Chlorhexidine; local anesthetic administered 20 gauge catheter was inserted into right femoral artery using the Seldinger technique.  Evaluation Blood flow good; BP tracing good. Complications: No apparent complications.   Sandra Johnston 04-21-2016

## 2017-02-03 NOTE — Progress Notes (Addendum)
Pt pulseless at 0836, CPR started. 1 epi given @0837 . Bicarb given @0838 . Pulse check at 0839, pulses back. BP 80/40 - gave 1L bolus, started Neo.

## 2017-02-03 NOTE — Progress Notes (Signed)
   01/19/2017 0900  Clinical Encounter Type  Visited With Patient and family together  Visit Type Code  Referral From Nurse  Consult/Referral To Chaplain  Spiritual Encounters  Spiritual Needs Emotional;Prayer;Grief support  Stress Factors  Family Stress Factors Family relationships;Major life changes  Chaplain was called to the patient room after a code blue.  Patient family arrived and Chaplain shared time with the family.  2 sisters which are daughters of PT are present.  Talked about family dynamics, faith, purpose and attachment with the family members.  In consult with doctor and family chaplain was present.  Chaplain offered support and prayer to the family as other family members are being called to the bedside.

## 2017-02-03 NOTE — Progress Notes (Signed)
At bedside shift report change this morning-Patient appeared very drowsy/sleepy, no signs of distress observed. Staff received a call shortly after leaving PT's bedside that Patient is "brady". RN immediately went to patient's room and found her unresponsive. CODE activated-Please see chart for course of event.  Patient transferred to [phone]

## 2017-02-03 NOTE — Progress Notes (Signed)
SLP Cancellation Note  Patient Details Name: Marlyn CorporalMigdalia Klingensmith MRN: [id number] DOB: 1946/03/28   Cancelled treatment:       Reason Eval/Treat Not Completed: Medical issues which prohibited therapy. Pt now intubated due to seizure activity. Will sign off, reorder when ready.    Sedale Jenifer, Riley NearingBonnie Caroline 01/11/2017, 9:01 AM

## 2017-02-03 NOTE — Progress Notes (Addendum)
Time of Death 1033, no heartbeat verified by 2 RNs. Dr. Molli KnockYacoub and Dr. Amada JupiterKirkpatrick aware. Family at bedside.

## 2017-02-03 NOTE — Procedures (Signed)
Central Venous Catheter Insertion Procedure Note Marlyn CorporalMigdalia Pitera [id number] [birth year]  Procedure: Insertion of Central Venous Catheter Indications: Assessment of intravascular volume and Drug and/or fluid administration  Procedure Details Consent: Unable to obtain consent because of emergent medical necessity. Time Out: Verified patient identification, verified procedure, site/side was marked, verified correct patient position, special equipment/implants available, medications/allergies/relevent history reviewed, required imaging and test results available.  Performed  Maximum sterile technique was used including antiseptics, cap, gloves, gown, hand hygiene, mask and sheet. Skin prep: Chlorhexidine; local anesthetic administered A antimicrobial bonded/coated triple lumen catheter was placed in the right femoral vein due to emergent situation using the Seldinger technique.  Evaluation Blood flow good Complications: No apparent complications Patient did tolerate procedure well. Chest X-ray ordered to verify placement.  CXR: pending.  YACOUB,WESAM 01/28/2017, 9:13 AM

## 2017-02-03 NOTE — Progress Notes (Signed)
PT Cancellation Note  Patient Details Name: Sandra CorporalMigdalia Johnston MRN: [id number] DOB: [birth year]   Cancelled Treatment:    Reason Eval/Treat Not Completed: Medical issues which prohibited therapy (pt was intubated this morning, will follow. )   Tamala SerUhlenberg, Devon Kingdon Kistler June 13, 2016, 9:33 AM [phone]

## 2017-02-03 NOTE — Progress Notes (Signed)
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 Now admitted with acute SZ. Extubated 10/14 evening. Then overnight she had a seizure of approx 35 min duration which finally stopped after 3mg  Ativan. Patient was loaded with Keppra and continued on Vimpat. However overnight she has not regained her prior mental status since this most recent seizure. Continuous EEG is in process.   REVIEW OF SYSTEMS:   Review of Systems  Unable to perform ROS: Mental status change   SUBJECTIVE:  Cardiac arrest this AM, very unstable  VITAL SIGNS: BP (!) 148/72 (BP Location: Left Arm)   Pulse 82   Temp 98.6 F (37 C) (Axillary)   Resp 18   Ht 4\' 11"  (1.499 m)   Wt 50.6 kg (111 lb 8.8 oz)   LMP  (LMP Unknown)   SpO2 97%   BMI 22.53 kg/m   VENTILATOR SETTINGS:    INTAKE / OUTPUT: I/O last 3 completed shifts: In: 2253.2 [P.O.:100; I.V.:1714.2; IV Piggyback:439] Out: 2025 [Urine:2025]  PHYSICAL EXAMINATION: General: Unresponsive, acutely ill appearing elderly female Neuro: Unresponsive, does not move anything to pain or command HEENT: Pupils fixed and dilated, gastric secretions in oral cavity Cardiovascular: RRR, Nl S1/S2, -M/R/G Lungs: Coarse BS diffusely Abdomen: Soft, NT, ND and +BS Musculoskeletal: no LE edema Skin: no rashes   LABS:  BMET  Recent Labs Lab 01/22/17 0445 01/23/17 0324 2017-02-09 0346  NA 135 136 133*  K 3.3* 3.4* 3.7  CL 101 103 101  CO2 24 26 24   BUN <5* <5* <5*  CREATININE 0.73 0.70 0.77  GLUCOSE 103* 104* 136*   Electrolytes  Recent Labs Lab 01/22/17 0445 01/23/17 0324 February 09, 2017 0346   CALCIUM 8.8* 9.0 8.9  MG 1.7 1.9 1.8  PHOS 3.0 3.5 2.7   CBC  Recent Labs Lab 01/22/17 0445 01/23/17 0324 02-09-17 0346  WBC 13.5* 12.8* 14.4*  HGB 11.6* 11.8* 11.3*  HCT 35.4* 34.2* 34.4*  PLT 662* 716* 794*   Coag's No results for input(s): APTT, INR in the last 168 hours.  Sepsis Markers No results for input(s): LATICACIDVEN, PROCALCITON, O2SATVEN in the last 168 hours.  ABG  Recent Labs Lab 01/17/17 1249  PHART 7.411  PCO2ART 40.8  PO2ART 125.0*   Liver Enzymes No results for input(s): AST, ALT, ALKPHOS, BILITOT, ALBUMIN in the last 168 hours.  Cardiac Enzymes No results for input(s): TROPONINI, PROBNP in the last 168 hours.  Glucose  Recent Labs Lab 01/23/17 1130 01/23/17 1649 01/23/17 2006 02/09/17 0034 02-09-17 0227 02-09-2017 0424  GLUCAP 95 123* 87 108* 103* 150*    Imaging No results found. STUDIES:  Ct Head Code Stroke Wo Contrast(01/18/2017):  IMPRESSION: 1. Mix of acute/subacute and chronic ischemia suspected in the right MCA territory. No associated hemorrhage or mass effect. 2. ASPECTS is 7 3. Underlying chronic small vessel disease.  CTA Head/Neck (10/14):  1. Negative for emergent large vessel occlusion. And no intracranial arterial stenosis or occlusion identified. 2. Positive for patent right cervical carotid stent with in-stent stenosis at the level of the right ICA origin up to 65%. 3. Negative left carotid and bilateral vertebral arteries. 4.  Intubated. ET tube terminates above the carina. Enteric tube courses into the esophagus.  CXR (10/15):  1. Interval extubation. 2. Shallow lung inflation with mild bibasilar atelectasis. No other active cardiopulmonary disease.   SIGNIFICANT EVENTS: Seizure 10/14 PM  LINES/TUBES: 18G and 20G PIV's ETT 10/14 >>10/14  ASSESSMENT / PLAN: 71 year old with history of seizure disorder, Alzheimer's, prior CVA with left hemiparesis admitted with seizure activity, intubated due to poor mental  status and inability to protect airway  PULMONARY Acute respiratory failure post arrest Concern for aspiration Plan: - Full vent support - ABG now - Adjust vent for ABG  - CXR now  CARDIOVASCULAR Cardiac arrest Cardiogenic shock Plan: - Neo for BP support - Tele monitoring  RENAL No active issues Plan: - BMET now and in AM - Replace electrolytes as indicated - NS at 75 ml/hr.  GASTROINTESTINAL No active issues Plan: - TF per nutrition  HEMATOLOGIC No active issues - CBC in AM - Transfuse per ICU protocol  INFECTIOUS No evidence of infection  ENDOCRINE 1. Hyperglycemia   - Continue SSI coverage; NPO  NEUROLOGIC Concern for ICH vs CVA Plan: - EEG now - STAT CT of the head - Hold all sedative - Neurology following, appreciate input  FAMILY  - Awaiting family arrival  The patient is critically ill with multiple organ systems failure and requires high complexity decision making for assessment and support, frequent evaluation and titration of therapies, application of advanced monitoring technologies and extensive interpretation of multiple databases.   Critical Care Time devoted to patient care services described in this note is  120  Minutes. This time reflects time of care of this signee Dr Koren BoundWesam Yacoub. This critical care time does not reflect procedure time, or teaching time or supervisory time of PA/NP/Med student/Med Resident etc but could involve care discussion time.  Alyson ReedyWesam G. Yacoub, M.D. [facility]. Pager: [phone]. After hours pager: [phone].  2016-06-22, 8:34 AM

## 2017-02-03 NOTE — Progress Notes (Signed)
Seizure activity resolved. Now postictal. Tone in RUE now normal.    Electronically signed: Dr. Caryl PinaEric Minnie Shi

## 2017-02-03 NOTE — Progress Notes (Signed)
Verified with Night shift RN-  3mg  of Ativan given during that shift for active seizure.

## 2017-02-03 NOTE — Progress Notes (Signed)
Came to patient's room to complete and I&O cath for retention post foley removal. Patient seizing, eyes were open, deviated to the left, she was not responsive, and extremities rigid with tremor.  Patient turned on right side, [facility] NP Schorr paged, new order for 1MG  IV ativan given @0220 . Seizure continued, neurology paged per NP Schorr recommendation. MD Lindzen paged, 1 MG IV ativan ordered at this time. MD Lindzen arrived at beside and new orders received.  Blood pressure and O2 sats remained stable throughout the episode, HR elevated into low 100's.  0500-vitals now stable, HR 80-85. Will continue to monitor.

## 2017-02-03 DEATH — deceased

## 2017-03-05 NOTE — Discharge Summary (Signed)
NAME:  [name], Sandra Johnston                  ACCOUNT NO.:  MEDICAL RECORD NO.:  [id number]  LOCATION:                                 FACILITY:  PHYSICIAN:  Felipa EvenerWesam Jake Alexavier Tsutsui, MD       DATE OF BIRTH:  DATE OF ADMISSION: DATE OF DISCHARGE:                              DISCHARGE SUMMARY   DEATH SUMMARY  PRIMARY DIAGNOSIS/CAUSE OF DEATH:  Pulseless electrical activity cardiac arrest.  SECONDARY DIAGNOSES:  Status epilepticus, Alzheimer disease, stroke with left-sided hemipareses, acute respiratory failure with hypoxemia, cardiogenic shock, hyperglycemia.  HOSPITAL COURSE:  The patient is a 71 year old female with past medical history significant for dementia, who presented to the Critical Care Service after pulseless electrical cardiac arrest __________ hospital for status epilepticus.  After CPR, the patient was intubated, central line placed, pressors were started.  However, hemodynamically the patient remained very poor.  Neurology was concerned about intracranial hemorrhage, however, due to hemodynamic instability, we were unable to obtain CT of the brain.  The patient remained very hemodynamically unstable at which point we gathered the family and after discussion, informed this patient has had a very poor quality of life after her last stroke that left her with left-sided hemiparesis and that she would not want this level of aggressive care at which point decision was made to make the patient full DNR with no further escalation of care.  If the patient would stabilize hemodynamically, then we would send them CT Scan to discern if the patient had intracranial hemorrhage versus repeat stroke as the neurologic exam was concerning for that per Neurology's note.  However, while in the intensive care unit, the patient suffered another cardiac arrest and given the fact that she was do not resuscitate at that point, no further aggressive care was provided and the patient was declared  dead and the family was notified.     Felipa EvenerWesam Jake Laura Caldas, MD     WJY/MEDQ  D:  02/15/2017  T:  02/15/2017  Job:  [id number]

## 2019-02-12 IMAGING — CT CT HEAD CODE STROKE
3 series · 14 of 47 positions shown, 16 images · non-contrast
Comparison: None.

CLINICAL DATA: Code stroke. 71-year-old female code stroke,
nonverbal, headache, seizure. Last seen normal 9635 hours.

EXAM:
CT HEAD WITHOUT CONTRAST
TECHNIQUE: Contiguous axial images were obtained from the base of the skull
through the vertex without intravenous contrast.

[Series 3: head 5.0 st · axial · 0.39mm/px · z∈[-135,-5]mm · 8 of 32 slices shown, 10 images]
[im 3/32  brain]
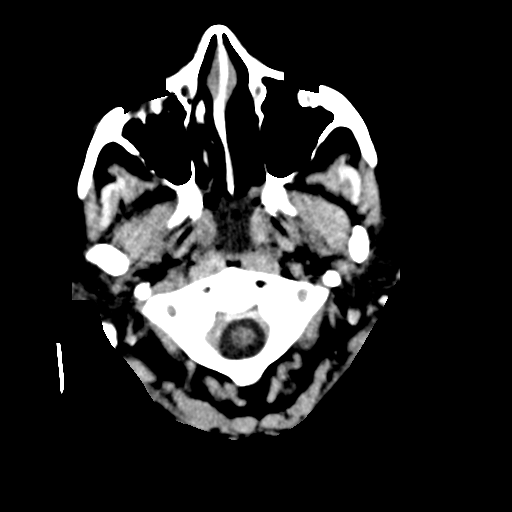
[im 3/32  bone]
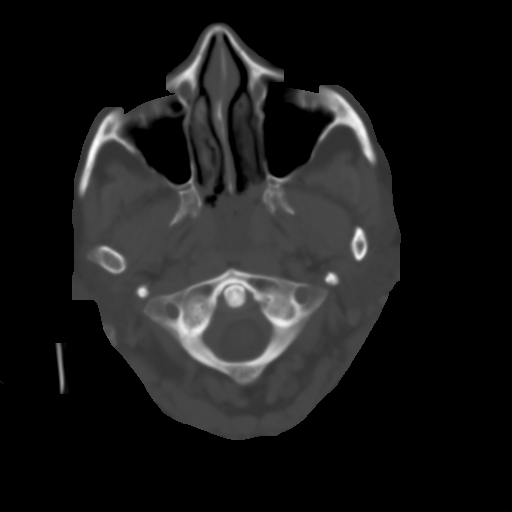
[im 7/32  brain]
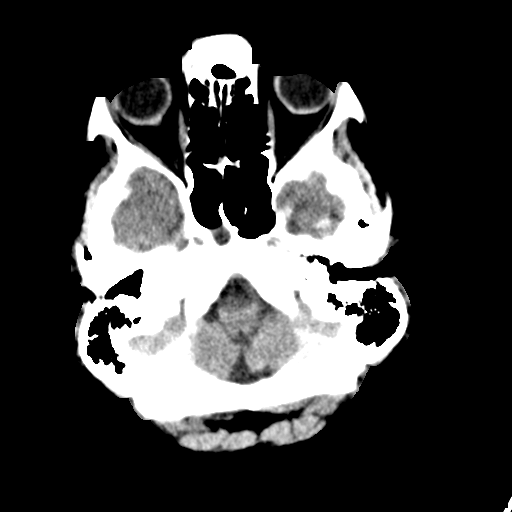
[im 10/32  brain]
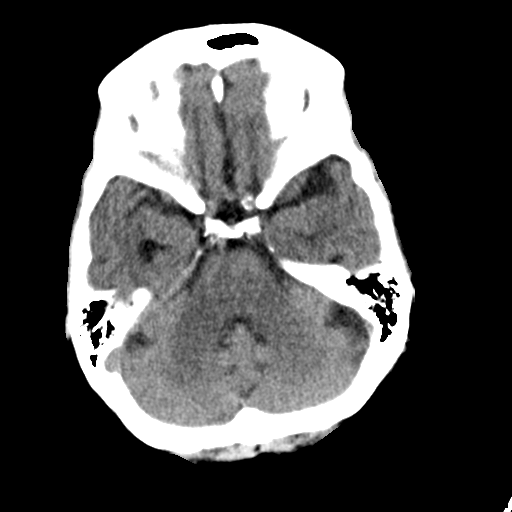
[im 14/32  brain]
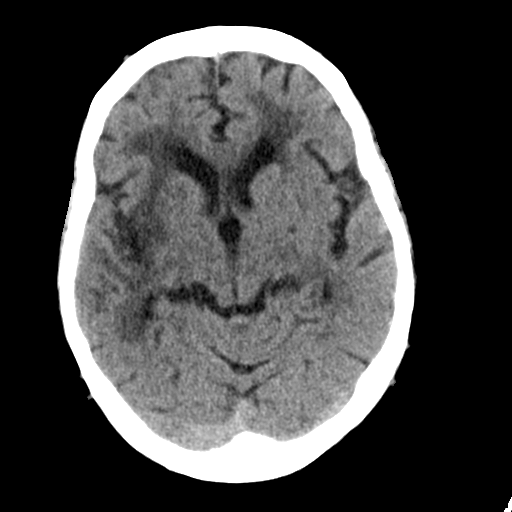
[im 18/32  brain]
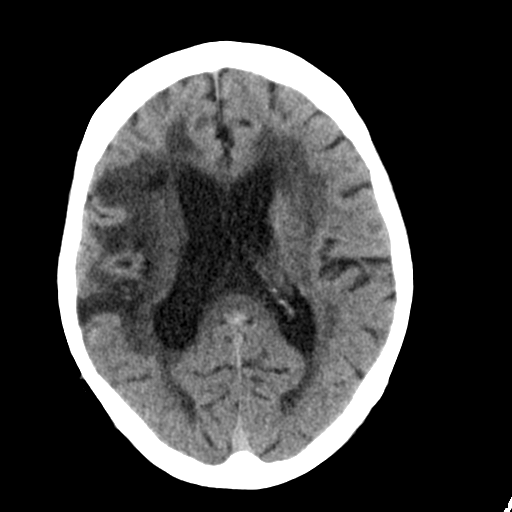
[im 18/32  bone]
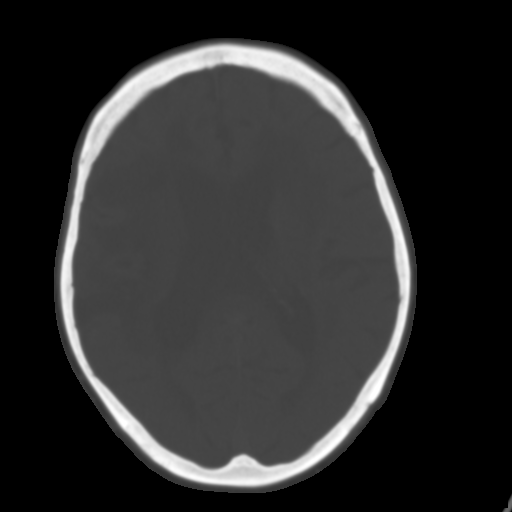
[im 22/32  brain]
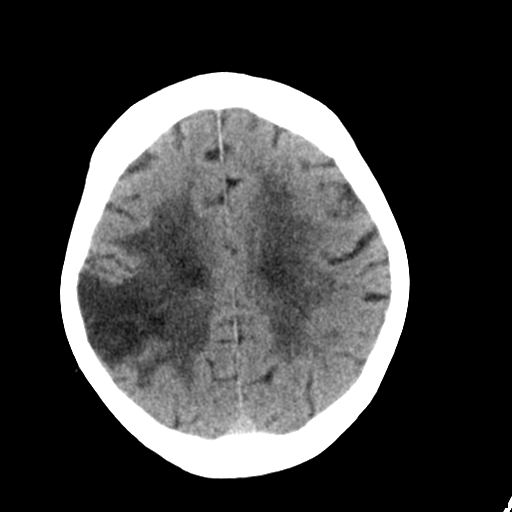
[im 25/32  brain]
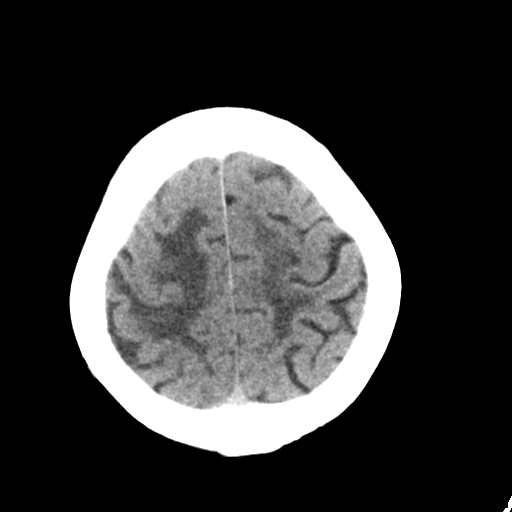
[im 29/32  brain]
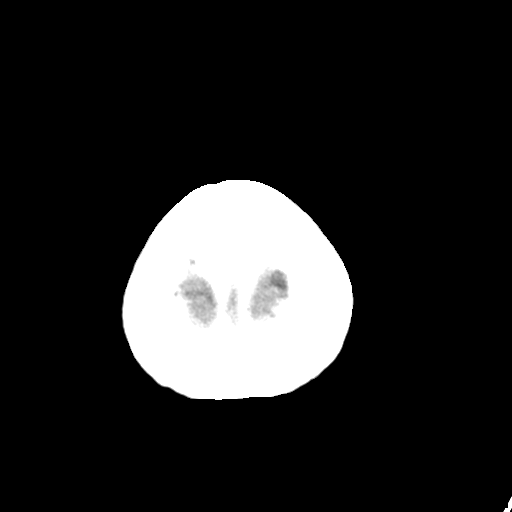

[Series 5: head 3.0 cor st · coronal · 0.31mm/px · 3 of 67 slices shown]
[im 23/67  brain]
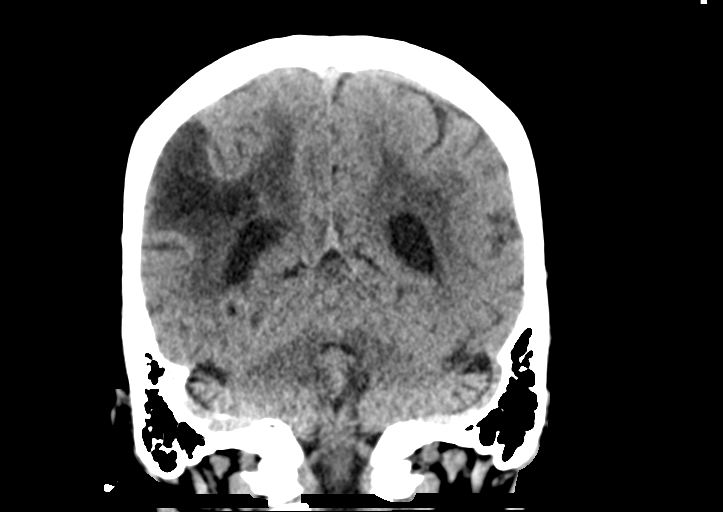
[im 30/67  brain]
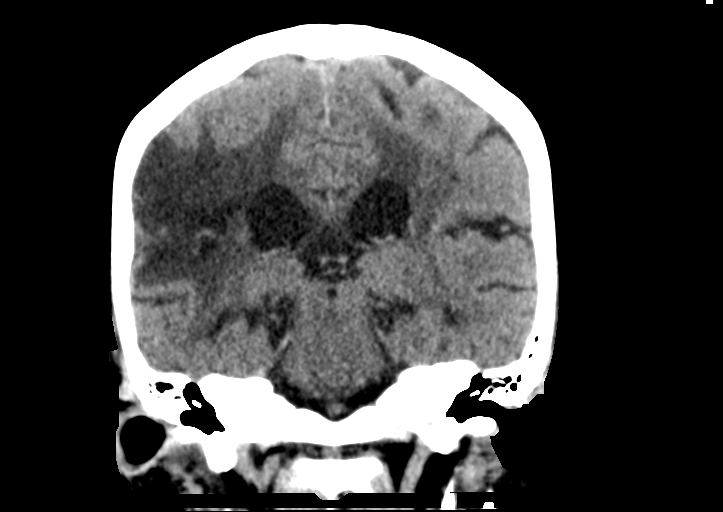
[im 37/67  brain]
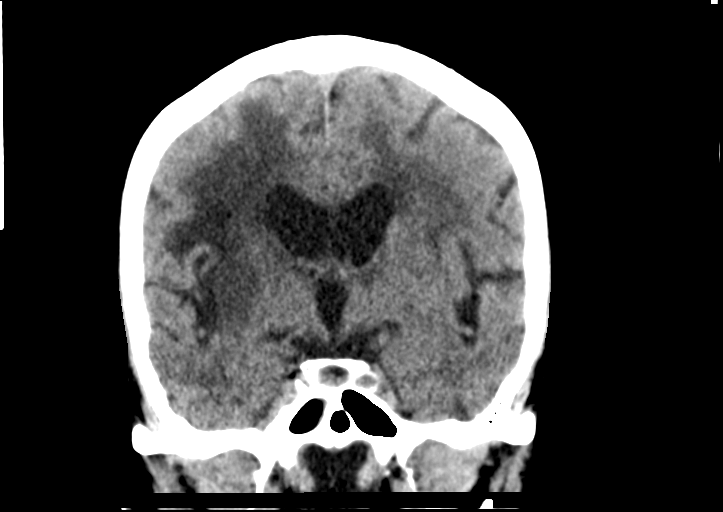

[Series 6: head 3.0 sag st · sagittal · 0.31mm/px · 3 of 61 slices shown]
[im 21/61  brain]
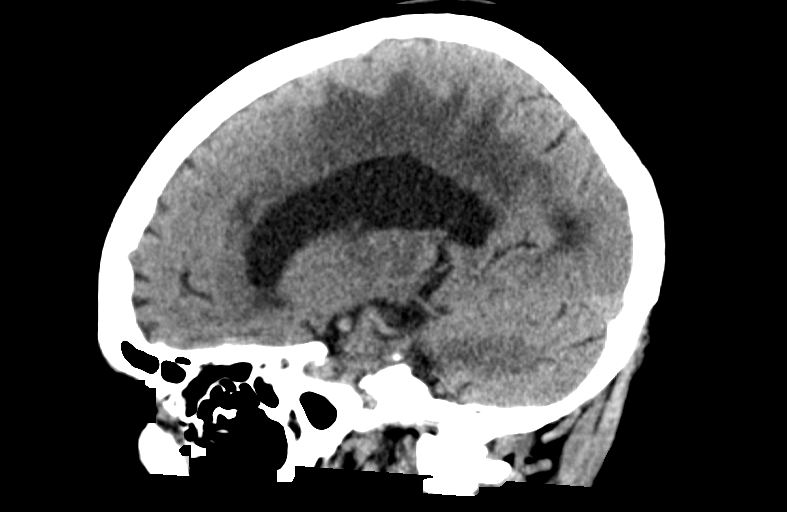
[im 31/61  brain]
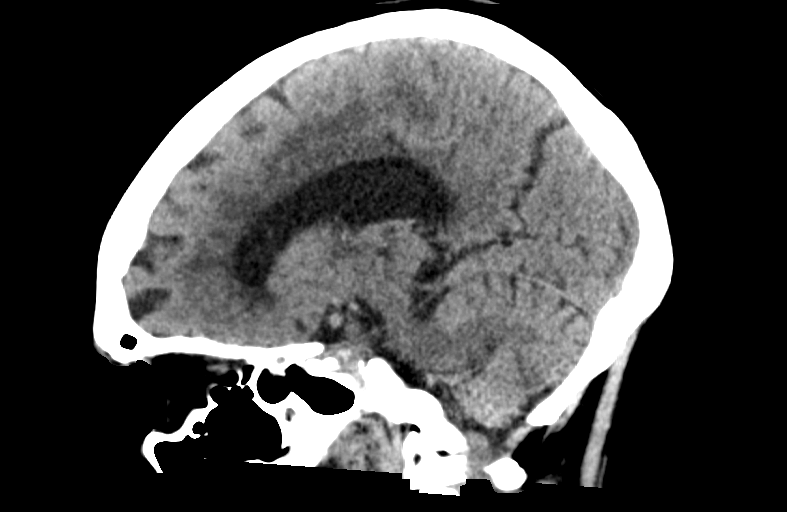
[im 41/61  brain]
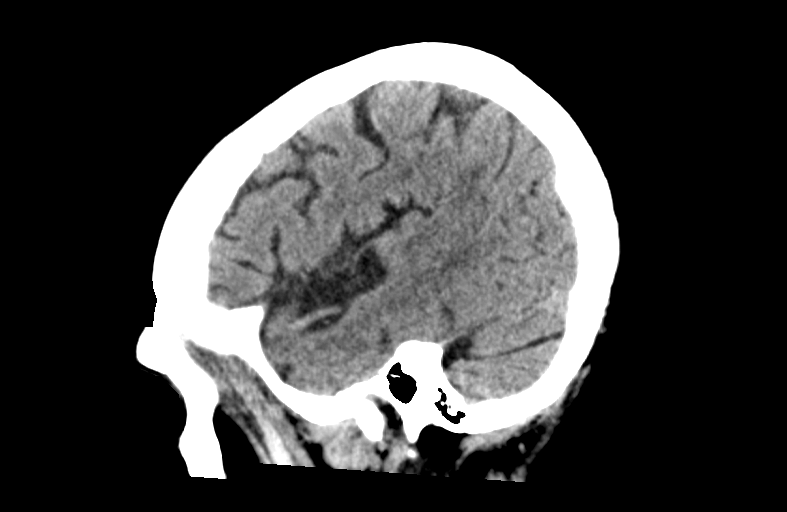

[14 of 47 positions shown; findings below may reference images not displayed]

FINDINGS: Brain: Chronic appearing encephalomalacia at the right operculum and
insula tracking toward the superior right peri-Rolandic cortex. Mild
asymmetric enlargement of the right lateral ventricle. Elsewhere
confluent bilateral abnormal cerebral white matter hypodensity.

Confluent acute to subacute appearing hypodensity throughout the
right basal ganglia (series 3, image 16). Patchy heterogeneous
hypodensity throughout the bilateral thalami.

No acute intracranial hemorrhage identified. No midline shift, mass
effect, or evidence of intracranial mass lesion. No other cytotoxic
edema identified.

Vascular: Calcified atherosclerosis at the skull base.

Skull: Negative.

Sinuses/Orbits: Intubated on the scout view. Fluid in the pharynx
and nasal cavity. Small fluid levels in both sphenoid sinuses.
Otherwise well pneumatized.

Other: Visualized orbits and scalp soft tissues are within normal
limits.

ASPECTS (Alberta Stroke Program Early CT Score)

- Ganglionic level infarction (caudate, lentiform nuclei, internal
capsule, insula, M1-M3 cortex): 4 (right insula, M1 and M2 thought
to be chronic)

- Supraganglionic infarction (M4-M6 cortex): 3 ( right M 5 thought
to be chronic)

Total score (0-10 with 10 being normal): 7
IMPRESSION: 1. Mix of acute/subacute and chronic ischemia suspected in the right
MCA territory. No associated hemorrhage or mass effect.
2. ASPECTS is 7
3. Underlying chronic small vessel disease.
4. The above was relayed via text pager to Dr. Yohan Manuel Plompen on
